# Patient Record
Sex: Female | Born: 2000 | Race: Black or African American | Hispanic: No | Marital: Single | Smoking: Never smoker
Health system: Southern US, Community
[De-identification: ages and names within clinical notes are randomized; demographics above are authoritative.]

## PROBLEM LIST (undated history)

## (undated) DIAGNOSIS — D509 Iron deficiency anemia, unspecified: Secondary | ICD-10-CM

## (undated) DIAGNOSIS — F419 Anxiety disorder, unspecified: Secondary | ICD-10-CM

## (undated) DIAGNOSIS — R718 Other abnormality of red blood cells: Secondary | ICD-10-CM

## (undated) DIAGNOSIS — D709 Neutropenia, unspecified: Secondary | ICD-10-CM

## (undated) HISTORY — DX: Neutropenia, unspecified: D70.9

## (undated) HISTORY — DX: Anxiety disorder, unspecified: F41.9

## (undated) HISTORY — PX: WISDOM TOOTH EXTRACTION: SHX21

## (undated) HISTORY — DX: Iron deficiency anemia, unspecified: D50.9

## (undated) HISTORY — DX: Other abnormality of red blood cells: R71.8

---
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
## 2018-03-31 ENCOUNTER — Ambulatory Visit (INDEPENDENT_AMBULATORY_CARE_PROVIDER_SITE_OTHER): Payer: BC Managed Care – PPO

## 2018-03-31 DIAGNOSIS — Z3042 Encounter for surveillance of injectable contraceptive: Secondary | ICD-10-CM

## 2018-03-31 DIAGNOSIS — Z3009 Encounter for other general counseling and advice on contraception: Secondary | ICD-10-CM | POA: Diagnosis not present

## 2018-03-31 MED ORDER — MEDROXYPROGESTERONE ACETATE 150 MG/ML IM SUSP
150.0000 mg | INTRAMUSCULAR | Status: DC
  Administered 2018-03-31: 150 mg via INTRAMUSCULAR

## 2018-05-25 ENCOUNTER — Telehealth: Payer: Self-pay | Admitting: Family Medicine

## 2018-05-25 NOTE — Telephone Encounter (Signed)
Copied from [place] [phone]. Topic: Quick Communication - See Telephone Encounter >> May 25, 2018  1:37 PM Mylinda Latina, NT wrote: CRM for notification. See Telephone encounter for: 05/25/18. Patient called and states she went on [facility] to odor a medication for acne and it is Doxcycline- Hyclate. She states that she needs a dr odor before she can get this medicine online. Please advise.Thank you  CB# [phone]

## 2018-05-26 NOTE — Telephone Encounter (Signed)
She will need to see a provider for a prescription I don't know what [facility] is

## 2018-05-29 NOTE — Telephone Encounter (Signed)
Spoke with Mendel Ryder (mom). Asked her to have pt to give the office a call to schedule an appt.

## 2018-06-30 ENCOUNTER — Ambulatory Visit (INDEPENDENT_AMBULATORY_CARE_PROVIDER_SITE_OTHER): Payer: BC Managed Care – PPO

## 2018-06-30 DIAGNOSIS — Z3042 Encounter for surveillance of injectable contraceptive: Secondary | ICD-10-CM | POA: Diagnosis not present

## 2018-06-30 MED ORDER — MEDROXYPROGESTERONE ACETATE 150 MG/ML IM SUSP
150.0000 mg | Freq: Once | INTRAMUSCULAR | Status: AC
  Administered 2018-06-30: 150 mg via INTRAMUSCULAR

## 2018-07-27 ENCOUNTER — Encounter: Payer: BC Managed Care – PPO | Admitting: Family Medicine

## 2018-08-24 ENCOUNTER — Ambulatory Visit: Payer: BC Managed Care – PPO

## 2018-09-19 ENCOUNTER — Other Ambulatory Visit: Payer: Self-pay | Admitting: Family Medicine

## 2018-09-20 NOTE — Telephone Encounter (Signed)
Patient has not been seen in over a year She will need a well woman exam with me or Benjamine Mola for this Depo Rx, or we'll ask her to go to student health or the health department Thank you

## 2018-09-20 NOTE — Telephone Encounter (Signed)
Copied from [place] [phone]. Topic: Quick Communication - Rx Refill/Question >> Sep 20, 2018  3:24 PM Bea Graff, NT wrote: Medication: medroxyPROGESTERone (DEPO-PROVERA) 150 MG/ML injection   Has the patient contacted their pharmacy? Yes.   (Agent: If no, request that the patient contact the pharmacy for the refill.) (Agent: If yes, when and what did the pharmacy advise?) No refills  Preferred Pharmacy (with phone number or street name): [place], [place] - [facility] [phone] (Phone) [phone] (Fax)    Agent: Please be advised that RX refills may take up to 3 business days. We ask that you follow-up with your pharmacy.

## 2018-09-21 NOTE — Telephone Encounter (Signed)
Tried to leave voicemail, but mailbox full. Will create CRM

## 2018-09-22 ENCOUNTER — Ambulatory Visit: Payer: BC Managed Care – PPO

## 2018-09-22 ENCOUNTER — Encounter: Payer: Self-pay | Admitting: Family Medicine

## 2018-09-27 ENCOUNTER — Encounter: Payer: Self-pay | Admitting: Family Medicine

## 2018-09-28 ENCOUNTER — Encounter: Payer: Self-pay | Admitting: Family Medicine

## 2018-10-09 ENCOUNTER — Encounter: Payer: Self-pay | Admitting: Family Medicine

## 2018-10-09 NOTE — Telephone Encounter (Signed)
Patient coming in for visit tomorrow; link was already sent to her

## 2018-10-09 NOTE — Telephone Encounter (Signed)
Closing old chart note

## 2018-10-10 ENCOUNTER — Ambulatory Visit (INDEPENDENT_AMBULATORY_CARE_PROVIDER_SITE_OTHER): Payer: BC Managed Care – PPO | Admitting: Family Medicine

## 2018-10-10 ENCOUNTER — Encounter: Payer: Self-pay | Admitting: Family Medicine

## 2018-10-10 VITALS — BP 118/68 | HR 95 | Temp 97.6°F | Ht 64.5 in | Wt 118.4 lb

## 2018-10-10 DIAGNOSIS — Z309 Encounter for contraceptive management, unspecified: Secondary | ICD-10-CM

## 2018-10-10 DIAGNOSIS — Z3042 Encounter for surveillance of injectable contraceptive: Secondary | ICD-10-CM | POA: Diagnosis not present

## 2018-10-10 DIAGNOSIS — Z118 Encounter for screening for other infectious and parasitic diseases: Secondary | ICD-10-CM

## 2018-10-10 DIAGNOSIS — F410 Panic disorder [episodic paroxysmal anxiety] without agoraphobia: Secondary | ICD-10-CM | POA: Diagnosis not present

## 2018-10-10 DIAGNOSIS — Z23 Encounter for immunization: Secondary | ICD-10-CM

## 2018-10-10 DIAGNOSIS — D485 Neoplasm of uncertain behavior of skin: Secondary | ICD-10-CM

## 2018-10-10 DIAGNOSIS — Z01419 Encounter for gynecological examination (general) (routine) without abnormal findings: Secondary | ICD-10-CM | POA: Diagnosis not present

## 2018-10-10 DIAGNOSIS — F332 Major depressive disorder, recurrent severe without psychotic features: Secondary | ICD-10-CM

## 2018-10-10 DIAGNOSIS — F5105 Insomnia due to other mental disorder: Secondary | ICD-10-CM

## 2018-10-10 LAB — POCT URINE PREGNANCY: Preg Test, Ur: NEGATIVE

## 2018-10-10 NOTE — Assessment & Plan Note (Addendum)
USPSTF grade A and B recommendations reviewed with patient; age-appropriate recommendations, preventive care, screening tests, etc discussed and encouraged; healthy living encouraged; see AVS for patient education given to patient Pap smear at age 17; will order chlamydia testing (no record of it on chart, though patient reports it was done earlier this year elsewhere)

## 2018-10-10 NOTE — Assessment & Plan Note (Signed)
I explained to the patient that I do NOT want to give her Depo today, as progesterone only birth control has been reported to worsen depression; I spoke with gynecology provider today while patient was here and they can provide a copper IUD without hormones; she agreed that hormonal contraception can worsen mood; stressed to patient that she can use abstinence or female or female condoms in the meantime and we'll have her get in to see the GYN provider ASAP for contraceptive management

## 2018-10-10 NOTE — Patient Instructions (Addendum)
Please go now to [facility] If they are not open or not able to help you, then go right to the emergency department  Use condoms for contraceptive  Take 500 mg of calcium daily (supplement)  Check your immunization records for the HPV and the meningitis vaccines Let us know OR go to student health or the health department to get those if needed  Try to avoid empty calories and get regular meals and snacks through the day  Here are some resources to help you if you feel you are in a mental health crisis:  [employer] - Call [phone]  for help - Website with more resources: GripTrip.com.pt  [place] - Call [phone] for help. - Mobile Crisis Program available 24 hours a day, 365 days a year. - Available for anyone of any age in [place] counties.  [facility] - Address: [address], [place] - Telephone: [phone]  - Hours of Operation: Sunday - Saturday - 8:00 a.m. - 8:00 p.m. - Medicaid, Medicare (Government Issued Only), BCBS, and [place] [facility], [place], Psychiatrists on-site to provide medication management, [place], and Peer Support Care.  [facility] - Call [phone] for help. - Mobile Crisis Program available 24 hours a day, 365 days a year. - Available for anyone of any age in [place]   Please put the phone down at 11 pm and don't pick it back up until morning Try to use the phone less than 2 hours per day total Put the phone down to avoid constant influx of informationCoping With Depression, Teen Depression is an experience of feeling down, blue, or sad. Depression can affect your thoughts and feelings, relationships, daily activities, and physical health. It is caused by changes in your brain that can be triggered by stress in your  life or a serious loss. Everyone experiences occasional disappointment, sadness, and loss in their lives. When you are feeling down, blue, or sad for at least 2 weeks in a row, it may mean that you have depression. If you receive a diagnosis of depression, your health care provider will tell you which type of depression you have and the possible treatments to help. How can depression affect me? Being depressed can make daily activities more difficult. It can negatively affect your daily life, from school and sports performance to work and relationships. When you are depressed, you may:  Want to be alone.  Avoid interacting with others.  Avoid doing the things you usually like to do.  Notice changes in your sleep habits.  Find it harder than usual to wake up and go to school or work.  Feel angry at everyone.  Feel like you do not have any patience.  Have trouble concentrating.  Feel tired all the time.  Notice changes in your appetite.  Lose or gain weight without trying.  Have constant headaches or stomachaches.  Think about death or attempting suicide often. What are things I can do to deal with depression? If you have had symptoms of depression for more than 2 weeks, talk with your parents or an adult you trust, such as a Social worker at school or church or a Leisure centre manager. You might be tempted to only tell friends, but you should tell an adult too. The hardest step in dealing with depression is admitting that you are feeling it to someone. The more people who know, the  more likely you will be to get some help. Certain types of counseling can be very helpful in treating depression. A counseling professional can assess what treatments are going to be most helpful for you. These may include:  Talk therapy.  Medicines.  Brain stimulation therapy. There are a number of other things you can do that can help you cope with depression on a daily basis, including:  Spending time in  nature.  Spending time with trusted friends who help you feel better.  Taking time to think about the positive things in your life and to feel grateful for them.  Exercising, such as playing an active game with some friends or going for a run.  Spending less time using electronics, especially at night before bed. The screens of TVs, computers, tablets, and phones make your brain think it is time to get up rather than go to bed.  Avoiding spending too much time spacing out on TV or video games. This might feel good for a while, but it ends up just being a way to avoid the feelings of depression. What should I do if my depression gets worse? If you are having trouble managing your depression or if your depression gets worse, talk to your health care provider about making adjustments to your treatment plan. You should get help immediately if:  You feel suicidal and are making a plan to commit suicide.  You are drinking or using drugs to stop the pain from your depression.  You are cutting yourself or thinking about cutting yourself.  You are thinking about hurting others and are making a plan to do so.  You believe the world would be better off without you in it.  You are isolating yourself completely and not talking with anyone. If you find yourself in any of these situations, you should do one of the following:  Immediately tell your parents or best friend.  Call and go see your health care provider or health professional.  Call the suicide prevention hotline 9714820171 in the [place]  Text the crisis line 810-447-5615 in the [place] Where can I get support? It is important to know that although depression is serious, you can find support from a variety of sources. Sources of help may include:  Suicide prevention, crisis prevention, and depression hotlines.  School teachers, counselors, Sports administrator, or clergy.  Parents or other family members.  Support groups. You can locate a  counselor or support group in your area from one of the following sources:  [place]: www.mentalhealthamerica.net  Anxiety and Depression Association of [place] (ADAA): https://www.clark.net/  National Alliance on Mental Illness (NAMI): www.nami.org This information is not intended to replace advice given to you by your health care provider. Make sure you discuss any questions you have with your health care provider. Document Released: 10/24/2015 Document Revised: 03/11/2016 Document Reviewed: 10/24/2015 Elsevier Interactive Patient Education  2019 [place]  After being diagnosed with an anxiety disorder, you may be relieved to know why you have felt or behaved a certain way. It is natural to also feel overwhelmed about the treatment ahead and what it will mean for your life. With care and support, you can manage this condition and recover from it. How to cope with anxiety Dealing with stress Stress is your body's reaction to life changes and events, both good and bad. Stress can last just a few hours or it can be ongoing. Stress can play a major role in anxiety,  so it is important to learn both how to cope with stress and how to think about it differently. Talk with your health care provider or a counselor to learn more about stress reduction. He or she may suggest some stress reduction techniques, such as:  Music therapy. This can include creating or listening to music that you enjoy and that inspires you.  Mindfulness-based meditation. This involves being aware of your normal breaths, rather than trying to control your breathing. It can be done while sitting or walking.  Centering prayer. This is a kind of meditation that involves focusing on a word, phrase, or sacred image that is meaningful to you and that brings you peace.  Deep breathing. To do this, expand your stomach and inhale slowly through your nose. Hold your breath for 3-5 seconds. Then exhale  slowly, allowing your stomach muscles to relax.  Self-talk. This is a skill where you identify thought patterns that lead to anxiety reactions and correct those thoughts.  Muscle relaxation. This involves tensing muscles then relaxing them. Choose a stress reduction technique that fits your lifestyle and personality. Stress reduction techniques take time and practice. Set aside 5-15 minutes a day to do them. Therapists can offer training in these techniques. The training may be covered by some insurance plans. Other things you can do to manage stress include:  Keeping a stress diary. This can help you learn what triggers your stress and ways to control your response.  Thinking about how you respond to certain situations. You may not be able to control everything, but you can control your reaction.  Making time for activities that help you relax, and not feeling guilty about spending your time in this way. Therapy combined with coping and stress-reduction skills provides the best chance for successful treatment. Medicines Medicines can help ease symptoms. Medicines for anxiety include:  Anti-anxiety drugs.  Antidepressants.  Beta-blockers. Medicines may be used as the main treatment for anxiety disorder, along with therapy, or if other treatments are not working. Medicines should be prescribed by a health care provider. Relationships Relationships can play a big part in helping you recover. Try to spend more time connecting with trusted friends and family members. Consider going to couples counseling, taking family education classes, or going to family therapy. Therapy can help you and others better understand the condition. How to recognize changes in your condition Everyone has a different response to treatment for anxiety. Recovery from anxiety happens when symptoms decrease and stop interfering with your daily activities at home or work. This may mean that you will start to:  Have  better concentration and focus.  Sleep better.  Be less irritable.  Have more energy.  Have improved memory. It is important to recognize when your condition is getting worse. Contact your health care provider if your symptoms interfere with home or work and you do not feel like your condition is improving. Where to find help and support: You can get help and support from these sources:  Self-help groups.  Online and [employer].  A trusted spiritual leader.  Couples counseling.  Family education classes.  Family therapy. Follow these instructions at home:  Eat a healthy diet that includes plenty of vegetables, fruits, whole grains, low-fat dairy products, and lean protein. Do not eat a lot of foods that are high in solid fats, added sugars, or salt.  Exercise. Most adults should do the following: ? Exercise for at least 150 minutes each week. The exercise should increase  your heart rate and make you sweat (moderate-intensity exercise). ? Strengthening exercises at least twice a week.  Cut down on caffeine, tobacco, alcohol, and other potentially harmful substances.  Get the right amount and quality of sleep. Most adults need 7-9 hours of sleep each night.  Make choices that simplify your life.  Take over-the-counter and prescription medicines only as told by your health care provider.  Avoid caffeine, alcohol, and certain over-the-counter cold medicines. These may make you feel worse. Ask your pharmacist which medicines to avoid.  Keep all follow-up visits as told by your health care provider. This is important. Questions to ask your health care provider  Would I benefit from therapy?  How often should I follow up with a health care provider?  How long do I need to take medicine?  Are there any long-term side effects of my medicine?  Are there any alternatives to taking medicine? Contact a health care provider if:  You have a hard time staying  focused or finishing daily tasks.  You spend many hours a day feeling worried about everyday life.  You become exhausted by worry.  You start to have headaches, feel tense, or have nausea.  You urinate more than normal.  You have diarrhea. Get help right away if:  You have a racing heart and shortness of breath.  You have thoughts of hurting yourself or others. If you ever feel like you may hurt yourself or others, or have thoughts about taking your own life, get help right away. You can go to your nearest emergency department or call:  Your local emergency services (911 in the [place]  A suicide crisis helpline, such as the [place] at 7696077209. This is open 24-hours a day. Summary  Taking steps to deal with stress can help calm you.  Medicines cannot cure anxiety disorders, but they can help ease symptoms.  Family, friends, and partners can play a big part in helping you recover from an anxiety disorder. This information is not intended to replace advice given to you by your health care provider. Make sure you discuss any questions you have with your health care provider. Document Released: 09/28/2016 Document Revised: 09/28/2016 Document Reviewed: 09/28/2016 Elsevier Interactive Patient Education  2019 [employer].

## 2018-10-10 NOTE — Progress Notes (Signed)
BP 118/68   Pulse 95   Temp 97.6 F (36.4 C)   Ht 5' 4.5" (1.638 m)   Wt 118 lb 6.4 oz (53.7 kg)   LMP  (LMP Unknown)   SpO2 94%   BMI 20.01 kg/m    Subjective:    Patient ID: Margaret Dunn, female    DOB: [birth year], 17 y.o.   MRN: [id number]  HPI: Margaret Dunn is a 17 y.o. female  Chief Complaint  Patient presents with  . Annual Exam    HPI Here for physical Pleased with Depo; aware of risks of osteoporosis and failure to protect against STDs Wishes to continue  USPSTF grade A and B recommendations Depression:  Depression screen [facility] 2/9 10/10/2018 07/22/2017  Decreased Interest 2 0  Down, Depressed, Hopeless 1 0  PHQ - 2 Score 3 0  Altered sleeping 3 -  Tired, decreased energy 3 -  Change in appetite 3 -  Feeling bad or failure about yourself  2 -  Trouble concentrating 3 -  Moving slowly or fidgety/restless 0 -  Suicidal thoughts 1 -  PHQ-9 Score 18 -  Difficult doing work/chores Somewhat difficult -   GAD 7 : Generalized Anxiety Score 10/10/2018  Nervous, Anxious, on Edge 2  Control/stop worrying 3  Worry too much - different things 3  Trouble relaxing 2  Restless 0  Easily annoyed or irritable 3  Afraid - awful might happen 3  Total GAD 7 Score 16  Anxiety Difficulty Somewhat difficult   She has been having thoughts of self harm, as recently as December 13th; she would do an overdose of pills  Hypertension: BP Readings from Last 3 Encounters:  10/10/18 118/68  07/22/17 108/62  10/21/16 118/78   Obesity: Wt Readings from Last 3 Encounters:  10/10/18 118 lb 6.4 oz (53.7 kg) (31 %, Z= -0.50)*  07/22/17 129 lb 1.6 oz (58.6 kg) (58 %, Z= 0.19)*  10/21/16 119 lb 9 oz (54.2 kg) (42 %, Z= -0.20)*   * Growth percentiles are based on CDC (Girls, 2-20 Years) data.   BMI Readings from Last 3 Encounters:  10/10/18 20.01 kg/m (28 %, Z= -0.58)*  07/22/17 21.82 kg/m (55 %, Z= 0.12)*  07/15/16 18.48 kg/m (15 %, Z= -1.04)*   * Growth  percentiles are based on CDC (Girls, 2-20 Years) data.     Skin cancer: new very dark mole on the right side of the neck, just appeared Lung cancer:  nonsmoker Breast cancer: no lumps Colorectal cancer: no fam hx Cervical cancer screening: start at age 29 BRCA gene screening: family hx of breast and/or ovarian cancer and/or metastatic prostate cancer? no HIV, hep B, hep C: already tested STD testing and prevention (chl/gon/syphilis): already tested Intimate partner violence: no abuse Contraception: Depo Osteoporosis: start calcium today; cautioned about risk of bone loss with ongoing use of Depo Fall prevention/vitamin D: discussed Immunizations: tetanus and flu today; patient to check on meningitis vaccine and HPV vaccine Diet: not a good eater; not enough fruits and veggies; just doesn't go to the caf, not really hungry; drinking empty calories; declined nutritionist Exercise: used to exercise, but stopped; planning to start exercising again Alcohol:    Office Visit from 10/10/2018 in [facility]  AUDIT-C Score  0      Tobacco use: no AAA: n/a Aspirin: n/a Glucose: check today No results found for: GLUCOSE, GLUCAP Lipids:  No results found for: CHOL No results found for: HDL No results found for:  [place] No results found for: TRIG No results found for: CHOLHDL No results found for: LDLDIRECT   Depression screen [facility] 2/9 10/10/2018 07/22/2017  Decreased Interest 2 0  Down, Depressed, Hopeless 1 0  PHQ - 2 Score 3 0  Altered sleeping 3 -  Tired, decreased energy 3 -  Change in appetite 3 -  Feeling bad or failure about yourself  2 -  Trouble concentrating 3 -  Moving slowly or fidgety/restless 0 -  Suicidal thoughts 1 -  PHQ-9 Score 18 -  Difficult doing work/chores Somewhat difficult -   Fall Risk  10/10/2018 07/22/2017  Falls in the past year? 0 No    Relevant past medical, surgical, family and social history reviewed Past Medical History:    Diagnosis Date  . Anxiety   . Iron deficiency anemia 10/22/2016  . Neutropenia ([place]) 10/22/2016  . 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 WISDOM TOOTH EXTRACTION     History reviewed. No pertinent family history. Social History   Tobacco Use  . Smoking status: Never Smoker  . Smokeless tobacco: Never Used  Substance Use Topics  . Alcohol use: No  . Drug use: No     Office Visit from 10/10/2018 in [facility]  AUDIT-C Score  0      Interim medical history since last visit reviewed. Allergies and medications reviewed  Review of Systems  Respiratory: Negative for shortness of breath.   Cardiovascular: Negative for chest pain.   Per HPI unless specifically indicated above     Objective:    BP 118/68   Pulse 95   Temp 97.6 F (36.4 C)   Ht 5' 4.5" (1.638 m)   Wt 118 lb 6.4 oz (53.7 kg)   LMP  (LMP Unknown)   SpO2 94%   BMI 20.01 kg/m   Wt Readings from Last 3 Encounters:  10/10/18 118 lb 6.4 oz (53.7 kg) (31 %, Z= -0.50)*  07/22/17 129 lb 1.6 oz (58.6 kg) (58 %, Z= 0.19)*  10/21/16 119 lb 9 oz (54.2 kg) (42 %, Z= -0.20)*   * Growth percentiles are based on CDC (Girls, 2-20 Years) data.    Physical Exam Constitutional:      Appearance: Normal appearance. She is well-developed.  HENT:     Right Ear: Hearing, tympanic membrane, ear canal and external ear normal.     Left Ear: Hearing, tympanic membrane, ear canal and external ear normal.     Mouth/Throat:     Pharynx: No posterior oropharyngeal erythema.  Eyes:     General: No scleral icterus.       Right eye: No hordeolum.        Left eye: No hordeolum.     Conjunctiva/sclera: Conjunctivae normal.  Neck:     Thyroid: No thyromegaly.     Vascular: No carotid bruit.  Cardiovascular:     Rate and Rhythm: Normal rate and regular rhythm.  No extrasystoles are present.    Heart sounds: Normal heart sounds, S1 normal and  S2 normal.  Pulmonary:     Effort: Pulmonary effort is normal. No respiratory distress.     Breath sounds: Normal breath sounds.  Abdominal:     General: Bowel sounds are normal. There is no distension or abdominal bruit.     Palpations: Abdomen is soft. There is no mass or pulsatile mass.     Tenderness: There is no abdominal  tenderness.     Hernia: No hernia is present.  Musculoskeletal: Normal range of motion.  Lymphadenopathy:     Head:     Right side of head: No submandibular adenopathy.     Left side of head: No submandibular adenopathy.     Cervical: No cervical adenopathy.  Skin:    General: Skin is warm and dry.     Coloration: Skin is not pale.     Findings: Lesion (macular very dark brown to black nevus on the RIGHT side neck) present. No bruising or ecchymosis.       Neurological:     Mental Status: She is alert.     Motor: No tremor or abnormal muscle tone.     Gait: Gait normal.     Deep Tendon Reflexes:     Reflex Scores:      Patellar reflexes are 2+ on the right side and 2+ on the left side. Psychiatric:        Mood and Affect: Mood is anxious and depressed. Affect is tearful.        Speech: Speech normal. Speech is not rapid and pressured.        Behavior: Behavior normal. Behavior is not slowed or aggressive.        Thought Content: Thought content includes suicidal ideation. Thought content includes suicidal plan.     Comments: Fair eye contact with examiner     Results for orders placed or performed in visit on 10/10/18  POCT urine pregnancy  Result Value Ref Range   Preg Test, Ur Negative Negative      Assessment & Plan:   Problem List Items Addressed This Visit      Other   Well woman exam - Primary    USPSTF grade A and B recommendations reviewed with patient; age-appropriate recommendations, preventive care, screening tests, etc discussed and encouraged; healthy living encouraged; see AVS for patient education given to patient Pap smear at  age 1; will order chlamydia testing (no record of it on chart, though patient reports it was done earlier this year elsewhere)      Relevant Orders   CBC with Differential/Platelet   COMPLETE METABOLIC PANEL WITH GFR   Lipid panel   TSH   Depo-Provera contraceptive status    I explained to the patient that I do NOT want to give her Depo today, as progesterone only birth control has been reported to worsen depression; I spoke with gynecology provider today while patient was here and they can provide a copper IUD without hormones; she agreed that hormonal contraception can worsen mood; stressed to patient that she can use abstinence or female or female condoms in the meantime and we'll have her get in to see the GYN provider ASAP for contraceptive management      Relevant Orders   POCT urine pregnancy (Completed)    Other Visit Diagnoses    Need for influenza vaccination       offered, given   Relevant Orders   Flu Vaccine QUAD 6+ mos PF IM (Fluarix Quad PF) (Completed)   Need for tetanus booster       offered, given   Relevant Orders   Tdap vaccine greater than or equal to 7yo IM (Completed)   Neoplasm of uncertain behavior of skin       refer to derm for evaluation   Relevant Orders   Ambulatory referral to Dermatology   Encounter for contraceptive management, unspecified type  discussed risk of worsening depression with hormonal contraceptives; other options such as female/female condoms, copper IUD; refer to GYN   Relevant Orders   Ambulatory referral to Obstetrics / Gynecology   Panic attack       refer to psychiatrist   Relevant Orders   Ambulatory referral to Psychiatry   Severe episode of recurrent major depressive disorder, without psychotic features ([place])       see AVS; she will go with her mother right from here to [place]; if [facility] not open, go to ER; need eval by psychiatrist; numbers provided; she agrees volultarily   Relevant Orders   Ambulatory referral to Psychiatry     Vitamin B12   VITAMIN D 25 Hydroxy (Vit-D Deficiency, Fractures)   Insomnia due to mental condition       refer to psychiatrist; stressed tot he patient the iportance of turning off her cell phone after 11 pm (she is on it until 5 am apparently)   Relevant Orders   Ambulatory referral to Psychiatry   Screening for chlamydial disease       patient reports done elsewhere, but no record of this; will test (vag swab, self-collection) upon her return   Relevant Orders   Cervicovaginal ancillary only      Follow up plan: Return in about 2 weeks (around 10/24/2018) for follow-up visit with Dr. Sanda Klein.  An after-visit summary was printed and given to the patient at [place].  Please see the patient instructions which may contain other information and recommendations beyond what is mentioned above in the assessment and plan.  No orders of the defined types were placed in this encounter.   Orders Placed This Encounter  Procedures  . Tdap vaccine greater than or equal to 7yo IM  . Flu Vaccine QUAD 6+ mos PF IM (Fluarix Quad PF)  . CBC with Differential/Platelet  . COMPLETE METABOLIC PANEL WITH GFR  . Lipid panel  . TSH  . Vitamin B12  . VITAMIN D 25 Hydroxy (Vit-D Deficiency, Fractures)  . Ambulatory referral to Dermatology  . Ambulatory referral to Obstetrics / Gynecology  . Ambulatory referral to Psychiatry  . POCT urine pregnancy

## 2018-10-11 ENCOUNTER — Other Ambulatory Visit: Payer: Self-pay | Admitting: Family Medicine

## 2018-10-11 DIAGNOSIS — E559 Vitamin D deficiency, unspecified: Secondary | ICD-10-CM | POA: Insufficient documentation

## 2018-10-11 LAB — LIPID PANEL
CHOLESTEROL: 165 mg/dL (ref <170)
HDL: 48 mg/dL (ref >45)
LDL Cholesterol (Calc): 103 mg/dL (calc) (ref <110)
Non-HDL Cholesterol (Calc): 117 mg/dL (calc) (ref <120)
Total CHOL/HDL Ratio: 3.4 (calc) (ref <5.0)
Triglycerides: 47 mg/dL (ref <90)

## 2018-10-11 LAB — COMPLETE METABOLIC PANEL WITH GFR
AG Ratio: 1.7 (calc) (ref 1.0–2.5)
ALT: 10 U/L (ref 5–32)
AST: 17 U/L (ref 12–32)
Albumin: 4.3 g/dL (ref 3.6–5.1)
Alkaline phosphatase (APISO): 76 U/L (ref 47–176)
BUN: 10 mg/dL (ref 7–20)
CO2: 23 mmol/L (ref 20–32)
Calcium: 9.8 mg/dL (ref 8.9–10.4)
Chloride: 105 mmol/L (ref 98–110)
Creat: 0.91 mg/dL (ref 0.50–1.00)
GFR, Est African American: 106 mL/min/{1.73_m2} (ref >=60)
GFR, Est Non African American: 91 mL/min/{1.73_m2} (ref >=60)
Globulin: 2.6 g/dL (calc) (ref 2.0–3.8)
Glucose, Bld: 83 mg/dL (ref 65–99)
Potassium: 4 mmol/L (ref 3.8–5.1)
SODIUM: 137 mmol/L (ref 135–146)
Total Bilirubin: 0.5 mg/dL (ref 0.2–1.1)
Total Protein: 6.9 g/dL (ref 6.3–8.2)

## 2018-10-11 LAB — CBC WITH DIFFERENTIAL/PLATELET
Absolute Monocytes: 431 cells/uL (ref 200–950)
Basophils Absolute: 40 cells/uL (ref 0–200)
Basophils Relative: 0.9 %
EOS PCT: 1.4 %
Eosinophils Absolute: 62 cells/uL (ref 15–500)
HEMATOCRIT: 39 % (ref 35.0–45.0)
Hemoglobin: 13.4 g/dL (ref 11.7–15.5)
Lymphs Abs: 2354 cells/uL (ref 850–3900)
MCH: 30 pg (ref 27.0–33.0)
MCHC: 34.4 g/dL (ref 32.0–36.0)
MCV: 87.2 fL (ref 80.0–100.0)
MPV: 9.9 fL (ref 7.5–12.5)
Monocytes Relative: 9.8 %
NEUTROS PCT: 34.4 %
Neutro Abs: 1514 cells/uL (ref 1500–7800)
Platelets: 302 10*3/uL (ref 140–400)
RBC: 4.47 10*6/uL (ref 3.80–5.10)
RDW: 12.2 % (ref 11.0–15.0)
Total Lymphocyte: 53.5 %
WBC: 4.4 10*3/uL (ref 3.8–10.8)

## 2018-10-11 LAB — VITAMIN D 25 HYDROXY (VIT D DEFICIENCY, FRACTURES): VIT D 25 HYDROXY: 14 ng/mL — AB (ref 30–100)

## 2018-10-11 LAB — VITAMIN B12: Vitamin B-12: 1031 pg/mL (ref 200–1100)

## 2018-10-11 LAB — TSH: TSH: 2.06 mIU/L

## 2018-10-11 MED ORDER — VITAMIN D (ERGOCALCIFEROL) 1.25 MG (50000 UNIT) PO CAPS: 50000.0000 [IU] | ORAL_CAPSULE | ORAL | 1 refills | Status: DC

## 2018-10-13 ENCOUNTER — Telehealth: Payer: Self-pay | Admitting: Obstetrics and Gynecology

## 2018-10-13 ENCOUNTER — Telehealth: Payer: Self-pay | Admitting: Obstetrics & Gynecology

## 2018-10-13 NOTE — Telephone Encounter (Signed)
paragard reserved for this patient.

## 2018-10-13 NOTE — Telephone Encounter (Signed)
Patient is schedule 10/20/17 with [facility] for paraguard

## 2018-10-13 NOTE — Telephone Encounter (Signed)
[facility] referring for Requesting copper IUD or other non-hormonal contraception for patient. Called and left voicemail for patient to call back to be schedule

## 2018-10-20 ENCOUNTER — Encounter: Payer: Self-pay | Admitting: Obstetrics and Gynecology

## 2018-10-20 ENCOUNTER — Ambulatory Visit (INDEPENDENT_AMBULATORY_CARE_PROVIDER_SITE_OTHER): Payer: BC Managed Care – PPO | Admitting: Obstetrics and Gynecology

## 2018-10-20 ENCOUNTER — Other Ambulatory Visit (HOSPITAL_COMMUNITY)
Admission: RE | Admit: 2018-10-20 | Discharge: 2018-10-20 | Disposition: A | Discharge status: Home / Self Care | Payer: BC Managed Care – PPO | Source: Ambulatory Visit | Attending: Obstetrics and Gynecology | Admitting: Obstetrics and Gynecology

## 2018-10-20 VITALS — BP 108/60 | Ht 64.0 in | Wt 121.0 lb

## 2018-10-20 DIAGNOSIS — Z113 Encounter for screening for infections with a predominantly sexual mode of transmission: Secondary | ICD-10-CM | POA: Insufficient documentation

## 2018-10-20 DIAGNOSIS — Z30014 Encounter for initial prescription of intrauterine contraceptive device: Secondary | ICD-10-CM

## 2018-10-20 MED ORDER — MISOPROSTOL 100 MCG PO TABS: 100.0000 ug | ORAL_TABLET | Freq: Once | ORAL | 0 refills | Status: DC

## 2018-10-20 NOTE — Progress Notes (Signed)
Arnetha Courser, MD   Chief Complaint  Patient presents with  . Contraception    IUD consultation    HPI:      [name] is a 18 y.o. G0P0000 who LMP was No LMP recorded (lmp unknown)., presents today for NP IUD consultation, referred by PCP. Pt was on depo for about 2 yrs. Wants to try non-hormonal method due to depression sx. Pt is not having any vaginal bleeding/dysmen with depo. Pt states menses prior to depo were monthly and "long". Last depo given about 10/19, hasn't had period yet.  Pt is sex active, using condoms. Had neg STD testing fall 2019 at school with current partner. Hx of chlamydia in the distant past, treated and had neg TOC.    Past Medical History:  Diagnosis Date  . Anxiety   . Iron deficiency anemia 10/22/2016  . Neutropenia ([place]) 10/22/2016  . 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 WISDOM TOOTH EXTRACTION      History reviewed. No pertinent family history.  Social History   Socioeconomic History  . Marital status: Significant Other    Spouse name: [name]  . Number of children: Not on file  . Years of education: 12  . Highest education level: Some college, no degree  Occupational History  . Not on file  Social Needs  . Financial resource strain: Not hard at all  . Food insecurity:    Worry: Never true    Inability: Never true  . Transportation needs:    Medical: No    Non-medical: No  Tobacco Use  . Smoking status: Never Smoker  . Smokeless tobacco: Never Used  Substance and Sexual Activity  . Alcohol use: No  . Drug use: No  . Sexual activity: Yes    Birth control/protection: None  Lifestyle  . Physical activity:    Days per week: 0 days    Minutes per session: 0 min  . Stress: Rather much  Relationships  . Social connections:    Talks on phone: More than three times a week    Gets together: More than three times a week    Attends religious  service: More than 4 times per year    Active member of club or organization: Yes    Attends meetings of clubs or organizations: Never    Relationship status: Never married  . Intimate partner violence:    Fear of current or ex partner: No    Emotionally abused: No    Physically abused: No    Forced sexual activity: No  Other Topics Concern  . Not on file  Social History Narrative  . Not on file    Outpatient Medications Prior to Visit  Medication Sig Dispense Refill  . Vitamin D, Ergocalciferol, (DRISDOL) 1.25 MG (50000 UT) CAPS capsule Take 1 capsule (50,000 Units total) by mouth every 7 (seven) days. 4 capsule 1  . PARAGARD INTRAUTERINE COPPER IU by Intrauterine route.     No facility-administered medications prior to visit.       ROS:  Review of Systems  Constitutional: Negative for fatigue, fever and unexpected weight change.  Respiratory: Negative for cough, shortness of breath and wheezing.   Cardiovascular: Negative for chest pain, palpitations and leg swelling.  Gastrointestinal: Negative for blood in stool, constipation, diarrhea, nausea and vomiting.  Endocrine: Negative for cold intolerance, heat intolerance and polyuria.  Genitourinary: Negative for  dyspareunia, dysuria, flank pain, frequency, genital sores, hematuria, menstrual problem, pelvic pain, urgency, vaginal bleeding, vaginal discharge and vaginal pain.  Musculoskeletal: Negative for back pain, joint swelling and myalgias.  Skin: Negative for rash.  Neurological: Negative for dizziness, syncope, light-headedness, numbness and headaches.  Hematological: Negative for adenopathy.  Psychiatric/Behavioral: Negative for agitation, confusion, sleep disturbance and suicidal ideas. The patient is not nervous/anxious.    BREAST: No symptoms   OBJECTIVE:   Vitals:  BP 108/60   Ht 5\' 4"  (1.626 m)   Wt 121 lb (54.9 kg)   LMP  (LMP Unknown)   BMI 20.77 kg/m   Physical Exam Vitals signs reviewed.    Constitutional:      Appearance: She is well-developed.  Pulmonary:     Effort: Pulmonary effort is normal.  Genitourinary:    General: Normal vulva.     Pubic Area: No rash.      Labia:        Right: No rash, tenderness or lesion.        Left: No rash, tenderness or lesion.      Vagina: Normal. No vaginal discharge, erythema or tenderness.     Cervix: Normal.     Uterus: Normal. Not enlarged and not tender.      Adnexa: Right adnexa normal and left adnexa normal.       Right: No mass or tenderness.         Left: No mass or tenderness.    Musculoskeletal: Normal range of motion.  Neurological:     Mental Status: She is alert and oriented to person, place, and time.  Psychiatric:        Behavior: Behavior normal.        Thought Content: Thought content normal.     Assessment/Plan: Encounter for initial prescription of intrauterine contraceptive device (IUD) - Paraguard discussed including pros/cons/risks/benefits. Given nullip, Rx cytotec and NSAIDs 1 hr before appt. RTO in 2 days (before goes back to college) - Plan: misoprostol (CYTOTEC) 100 MCG tablet  Screening for STD (sexually transmitted disease) - Plan: Cervicovaginal ancillary only    Meds ordered this encounter  Medications  . misoprostol (CYTOTEC) 100 MCG tablet    Sig: Take 1 tablet (100 mcg total) by mouth once for 1 dose. 1 hour before appt    Dispense:  1 tablet    Refill:  0    Order Specific Question:   Supervising Provider    Answer:   Gae Dry [[id number]]      Return in 3 days (on 10/23/2018) for Paragard insertion with ABC 10/23/17.  Hollynn Garno B. Alyiah Ulloa, PA-C 10/20/2018 2:44 PM

## 2018-10-20 NOTE — Patient Instructions (Signed)
I value your feedback and entrusting us with your care. If you get a  patient survey, I would appreciate you taking the time to let us know about your experience today. Thank you! 

## 2018-10-23 ENCOUNTER — Ambulatory Visit (INDEPENDENT_AMBULATORY_CARE_PROVIDER_SITE_OTHER): Payer: BC Managed Care – PPO | Admitting: Obstetrics and Gynecology

## 2018-10-23 ENCOUNTER — Encounter: Payer: Self-pay | Admitting: Obstetrics and Gynecology

## 2018-10-23 VITALS — BP 110/80 | Ht 64.0 in | Wt 121.0 lb

## 2018-10-23 DIAGNOSIS — Z30430 Encounter for insertion of intrauterine contraceptive device: Secondary | ICD-10-CM

## 2018-10-23 LAB — CERVICOVAGINAL ANCILLARY ONLY
Chlamydia: NEGATIVE
Neisseria Gonorrhea: NEGATIVE

## 2018-10-23 NOTE — Patient Instructions (Signed)
I value your feedback and entrusting us with your care. If you get a  patient survey, I would appreciate you taking the time to let us know about your experience today. Thank you!  [facility] [phone]  Instructions after IUD insertion  Most women experience no significant problems after insertion of an IUD, however minor cramping and spotting for a few days is common. Cramps may be treated with ibuprofen 800mg every 8 hours or Tylenol 650 mg every 4 hours. Contact [facility] immediately if you experience any of the following symptoms during the next week: temperature >99.6 degrees, worsening pelvic pain, abdominal pain, fainting, unusually heavy vaginal bleeding, foul vaginal discharge, or if you think you have expelled the IUD.  Nothing inserted in the vagina for 48 hours. You will be scheduled for a follow up visit in approximately four weeks.  You should check monthly to be sure you can feel the IUD strings in the upper vagina. If you are having a monthly period, try to check after each period. If you cannot feel the IUD strings,  contact [facility] immediately so we can do an exam to determine if the IUD has been expelled.   Please use backup protection until we can confirm the IUD is in place.  Call [facility] if you are exposed to or diagnosed with a sexually transmitted infection, as we will need to discuss whether it is safe for you to continue using an IUD.   

## 2018-10-23 NOTE — Progress Notes (Signed)
   Chief Complaint  Patient presents with  . Contraception    IUD insertion     IUD PROCEDURE NOTE:  Margaret Dunn is a 18 y.o. G0P0000 here for Paragard  IUD insertion for [facility]. Currently on depo, due for injection later this month. Wants non-hormonal method due to depression sx. STD testing done 10/20/18.  BP 110/80   Ht 5\' 4"  (1.626 m)   Wt 121 lb (54.9 kg)   LMP  (LMP Unknown)   BMI 20.77 kg/m   IUD Insertion Procedure Note Patient identified, informed consent performed, consent signed.   Discussed risks of irregular bleeding, cramping, infection, malpositioning or misplacement of the IUD outside the uterus which may require further procedure such as laparoscopy, risk of failure <1%. Time out was performed.    Speculum placed in the vagina.  Cervix visualized.  Cleaned with Betadine x 2.  Grasped anteriorly with a single tooth tenaculum.  Uterus sounded to 6.0 cm.   IUD placed per manufacturer's recommendations.  Strings trimmed to 3 cm. Tenaculum was removed, good hemostasis noted.  Patient tolerated procedure well.   ASSESSMENT:  Encounter for insertion of intrauterine contraceptive device (IUD)   Plan:  Patient was given post-procedure instructions.  She was advised to have backup contraception for one week.   Call if you are having increasing pain, cramps or bleeding or if you have a fever greater than 100.4 degrees F., shaking chills, nausea or vomiting. Patient was also asked to check IUD strings periodically and follow up in 4 weeks for IUD check.  Return in about 4 weeks (around 11/20/2018) for IUD f/u.  Taegan Haider B. Selyna Klahn, PA-C 10/23/2018 9:20 AM

## 2018-11-27 ENCOUNTER — Ambulatory Visit: Payer: BC Managed Care – PPO | Admitting: Obstetrics and Gynecology

## 2018-12-25 ENCOUNTER — Ambulatory Visit (INDEPENDENT_AMBULATORY_CARE_PROVIDER_SITE_OTHER): Payer: BC Managed Care – PPO | Admitting: Certified Nurse Midwife

## 2018-12-25 ENCOUNTER — Encounter: Payer: Self-pay | Admitting: Certified Nurse Midwife

## 2018-12-25 VITALS — BP 128/84 | HR 82 | Ht 64.0 in | Wt 125.9 lb

## 2018-12-25 DIAGNOSIS — Z862 Personal history of diseases of the blood and blood-forming organs and certain disorders involving the immune mechanism: Secondary | ICD-10-CM | POA: Diagnosis not present

## 2018-12-25 DIAGNOSIS — Z30431 Encounter for routine checking of intrauterine contraceptive device: Secondary | ICD-10-CM

## 2018-12-25 DIAGNOSIS — N921 Excessive and frequent menstruation with irregular cycle: Secondary | ICD-10-CM | POA: Diagnosis not present

## 2018-12-25 DIAGNOSIS — N644 Mastodynia: Secondary | ICD-10-CM

## 2018-12-25 DIAGNOSIS — Z975 Presence of (intrauterine) contraceptive device: Secondary | ICD-10-CM

## 2018-12-25 NOTE — Patient Instructions (Addendum)
WE WOULD LOVE TO HEAR FROM YOU!!!!   Thank you Margaret Dunn for visiting [facility].  Providing our patients with the best experience possible is really important to [place], and we hope that you felt that on your recent visit. The most valuable feedback we get comes from [place]!!    If you receive a survey please take a couple of minutes to let us know how we did.Thank you for continuing to trust [place] with your care.   [facility]   Ethinyl Estradiol; Norethindrone Acetate; Ferrous fumarate tablets or capsules What is this medicine? ETHINYL ESTRADIOL; NORETHINDRONE ACETATE; FERROUS FUMARATE (ETH in il es tra DYE ole; nor eth IN drone AS e tate; FER [place] FUE ma rate) is an oral contraceptive. The products combine two types of female hormones, an estrogen and a progestin. They are used to prevent ovulation and pregnancy. Some products are also used to treat acne in females. This medicine may be used for other purposes; ask your health care provider or pharmacist if you have questions. COMMON BRAND NAME(S): Aurovela [address] 1/20, Aurovela Fe, Blisovi [address], [address] Fe, [place] Fe, Gildess 24 Fe, Gildess Fe 1.5/30, Gildess Fe 1/20, Hailey 24 Fe, Junel Fe 1.5/30, Junel Fe 1/20, Junel Fe 24, Larin Fe, Lo Loestrin Fe, Loestrin 24 Fe, Loestrin FE 1.5/30, Loestrin FE 1/20, Lomedia 24 Fe, Microgestin 24 Fe, Microgestin Fe 1.5/30, Microgestin Fe 1/20, Tarina 24 Fe, Tarina Fe 1/20, Taytulla, Tilia Fe, Tri-Legest Fe What should I tell my health care provider before I take this medicine? They need to know if you have any of these conditions: -abnormal vaginal bleeding -blood vessel disease -breast, cervical, endometrial, ovarian, liver, or uterine cancer -diabetes -gallbladder disease -heart disease or recent heart attack -high blood pressure -high cholesterol -history of blood clots -kidney disease -liver disease -migraine headaches -smoke  tobacco -stroke -systemic lupus erythematosus (SLE) -an unusual or allergic reaction to estrogens, progestins, other medicines, foods, dyes, or preservatives -pregnant or trying to get pregnant -breast-feeding How should I use this medicine? Take this medicine by mouth. To reduce nausea, this medicine may be taken with food. Follow the directions on the prescription label. Take this medicine at the same time each day and in the order directed on the package. Do not take your medicine more often than directed. A patient package insert for the product will be given with each prescription and refill. Read this sheet carefully each time. The sheet may change frequently. Contact your pediatrician regarding the use of this medicine in children. Special care may be needed. This medicine has been used in female children who have started having menstrual periods. Overdosage: If you think you have taken too much of this medicine contact a poison control center or emergency room at once. NOTE: This medicine is only for you. Do not share this medicine with others. What if I miss a dose? If you miss a dose, refer to the patient information sheet you received with your medicine for direction. If you miss more than one pill, this medicine may not be as effective and you may need to use another form of birth control. What may interact with this medicine? Do not take this medicine with the following medication: -dasabuvir; ombitasvir; paritaprevir; ritonavir -ombitasvir; paritaprevir; ritonavir This medicine may also interact with the following medications: -acetaminophen -antibiotics or medicines for infections, especially rifampin, rifabutin, rifapentine, and griseofulvin, and possibly penicillins or tetracyclines -aprepitant -ascorbic acid (vitamin C) -atorvastatin -barbiturate medicines, such as  phenobarbital -bosentan -carbamazepine -caffeine -clofibrate -cyclosporine -dantrolene -doxercalciferol -felbamate -grapefruit juice -hydrocortisone -medicines for anxiety or sleeping problems, such as diazepam or temazepam -medicines for diabetes, including pioglitazone -mineral oil -modafinil -mycophenolate -nefazodone -oxcarbazepine -phenytoin -prednisolone -ritonavir or other medicines for HIV infection or AIDS -rosuvastatin -selegiline -soy isoflavones supplements -St. John's wort -tamoxifen or raloxifene -theophylline -thyroid hormones -topiramate -warfarin This list may not describe all possible interactions. Give your health care provider a list of all the medicines, herbs, non-prescription drugs, or dietary supplements you use. Also tell them if you smoke, drink alcohol, or use illegal drugs. Some items may interact with your medicine. What should I watch for while using this medicine? Visit your doctor or health care professional for regular checks on your progress. You will need a regular breast and pelvic exam and Pap smear while on this medicine. Use an additional method of contraception during the first cycle that you take these tablets. If you have any reason to think you are pregnant, stop taking this medicine right away and contact your doctor or health care professional. If you are taking this medicine for hormone related problems, it may take several cycles of use to see improvement in your condition. Smoking increases the risk of getting a blood clot or having a stroke while you are taking birth control pills, especially if you are more than 20 years old. You are strongly advised not to smoke. This medicine can make your body retain fluid, making your fingers, hands, or ankles swell. Your blood pressure can go up. Contact your doctor or health care professional if you feel you are retaining fluid. This medicine can make you more sensitive to the sun. Keep out  of the sun. If you cannot avoid being in the sun, wear protective clothing and use sunscreen. Do not use sun lamps or tanning beds/booths. If you wear contact lenses and notice visual changes, or if the lenses begin to feel uncomfortable, consult your eye care specialist. In some women, tenderness, swelling, or minor bleeding of the gums may occur. Notify your dentist if this happens. Brushing and flossing your teeth regularly may help limit this. See your dentist regularly and inform your dentist of the medicines you are taking. If you are going to have elective surgery, you may need to stop taking this medicine before the surgery. Consult your health care professional for advice. This medicine does not protect you against HIV infection (AIDS) or any other sexually transmitted diseases. What side effects may I notice from receiving this medicine? Side effects that you should report to your doctor or health care professional as soon as possible: -allergic reactions like skin rash, itching or hives, swelling of the face, lips, or tongue -breast tissue changes or discharge -changes in vaginal bleeding during your period or between your periods -changes in vision -chest pain -confusion -coughing up blood -dizziness -feeling faint or lightheaded -headaches or migraines -leg, arm or groin pain -loss of balance or coordination -severe or sudden headaches -stomach pain (severe) -sudden shortness of breath -sudden numbness or weakness of the face, arm or leg -symptoms of vaginal infection like itching, irritation or unusual discharge -tenderness in the upper abdomen -trouble speaking or understanding -vomiting -yellowing of the eyes or skin Side effects that usually do not require medical attention (report to your doctor or health care professional if they continue or are bothersome): -breakthrough bleeding and spotting that continues beyond the 3 initial cycles of pills -breast  tenderness -mood changes, anxiety, depression, frustration, anger,  or emotional outbursts -increased sensitivity to sun or ultraviolet light -nausea -skin rash, acne, or brown spots on the skin -weight gain (slight) This list may not describe all possible side effects. Call your doctor for medical advice about side effects. You may report side effects to FDA at 1-800-FDA-1088. Where should I keep my medicine? Keep out of the reach of children. Store at room temperature between 15 and 30 degrees C (59 and 86 degrees F). Throw away any unused medicine after the expiration date. NOTE: This sheet is a summary. It may not cover all possible information. If you have questions about this medicine, talk to your doctor, pharmacist, or health care provider.  2019 Elsevier/Gold Standard (2016-06-14 08:04:41)   Preventive Care 18-39 Years, Female Preventive care refers to lifestyle choices and visits with your health care provider that can promote health and wellness. What does preventive care include?   A yearly physical exam. This is also called an annual well check.  Dental exams once or twice a year.  Routine eye exams. Ask your health care provider how often you should have your eyes checked.  Personal lifestyle choices, including: ? Daily care of your teeth and gums. ? Regular physical activity. ? Eating a healthy diet. ? Avoiding tobacco and drug use. ? Limiting alcohol use. ? Practicing safe sex. ? Taking vitamin and mineral supplements as recommended by your health care provider. What happens during an annual well check? The services and screenings done by your health care provider during your annual well check will depend on your age, overall health, lifestyle risk factors, and family history of disease. Counseling Your health care provider may ask you questions about your:  Alcohol use.  Tobacco use.  Drug use.  Emotional well-being.  Home and relationship  well-being.  Sexual activity.  Eating habits.  Work and work Statistician.  Method of birth control.  Menstrual cycle.  Pregnancy history. Screening You may have the following tests or measurements:  Height, weight, and BMI.  Diabetes screening. This is done by checking your blood sugar (glucose) after you have not eaten for a while (fasting).  Blood pressure.  Lipid and cholesterol levels. These may be checked every 5 years starting at age 29.  Skin check.  Hepatitis C blood test.  Hepatitis B blood test.  Sexually transmitted disease (STD) testing.  BRCA-related cancer screening. This may be done if you have a family history of breast, ovarian, tubal, or peritoneal cancers.  Pelvic exam and Pap test. This may be done every 3 years starting at age 56. Starting at age 45, this may be done every 5 years if you have a Pap test in combination with an HPV test. Discuss your test results, treatment options, and if necessary, the need for more tests with your health care provider. Vaccines Your health care provider may recommend certain vaccines, such as:  Influenza vaccine. This is recommended every year.  Tetanus, diphtheria, and acellular pertussis (Tdap, Td) vaccine. You may need a Td booster every 10 years.  Varicella vaccine. You may need this if you have not been vaccinated.  HPV vaccine. If you are 60 or younger, you may need three doses over 6 months.  Measles, mumps, and rubella (MMR) vaccine. You may need at least one dose of MMR. You may also need a second dose.  Pneumococcal 13-valent conjugate (PCV13) vaccine. You may need this if you have certain conditions and were not previously vaccinated.  Pneumococcal polysaccharide (PPSV23) vaccine. You may need  one or two doses if you smoke cigarettes or if you have certain conditions.  Meningococcal vaccine. One dose is recommended if you are age 36-21 years and a first-year college student living in a residence  hall, or if you have one of several medical conditions. You may also need additional booster doses.  Hepatitis A vaccine. You may need this if you have certain conditions or if you travel or work in places where you may be exposed to hepatitis A.  Hepatitis B vaccine. You may need this if you have certain conditions or if you travel or work in places where you may be exposed to hepatitis B.  Haemophilus influenzae type b (Hib) vaccine. You may need this if you have certain risk factors. Talk to your health care provider about which screenings and vaccines you need and how often you need them. This information is not intended to replace advice given to you by your health care provider. Make sure you discuss any questions you have with your health care provider. Document Released: 11/30/2001 Document Revised: 05/17/2017 Document Reviewed: 08/05/2015 Elsevier Interactive Patient Education  2019 Elsevier Inc.   Breast Tenderness Breast tenderness is a common problem for women of all ages. Breast tenderness may cause mild discomfort to severe pain. The pain usually comes and goes in association with your menstrual cycle, but it can be constant. Breast tenderness has many possible causes, including hormone changes and some medicines. Your health care provider may order tests, such as a mammogram or an ultrasound, to check for any unusual findings. Having breast tenderness usually does not mean that you have breast cancer. Follow these instructions at home: Sometimes, reassurance that you do not have breast cancer is all that is needed. In general, follow these home care instructions: Managing pain and discomfort   If directed, apply ice to the area: ? Put ice in a plastic bag. ? Place a towel between your skin and the bag. ? Leave the ice on for 20 minutes, 2-3 times a day.  Make sure you are wearing a supportive bra, especially during exercise. You may also want to wear a supportive bra while  sleeping if your breasts are very tender. Medicines  Take over-the-counter and prescription medicines only as told by your health care provider. If the cause of your pain is infection, you may be prescribed an antibiotic medicine.  If you were prescribed an antibiotic, take it as told by your health care provider. Do not stop taking the antibiotic even if you start to feel better. General instructions   Your health care provider may recommend that you reduce the amount of fat in your diet. You can do this by: ? Limiting fried foods. ? Cooking foods using methods, such as baking, boiling, grilling, and broiling.  Decrease the amount of caffeine in your diet. You can do this by drinking more water and choosing caffeine-free options.  Keep a log of the days and times when your breasts are most tender.  Ask your health care provider how to do breast exams at home. This will help you notice if you have an unusual growth or lump. Contact a health care provider if:  Any part of your breast is hard, red, and hot to the touch. This may be a sign of infection.  You are not breastfeeding and you have fluid, especially blood or pus, coming out of your nipples.  You have a fever.  You have a new or painful lump in your breast  that remains after your menstrual period ends.  Your pain does not improve or it gets worse.  Your pain is interfering with your daily activities. This information is not intended to replace advice given to you by your health care provider. Make sure you discuss any questions you have with your health care provider. Document Released: 09/16/2008 Document Revised: 07/02/2016 Document Reviewed: 07/02/2016 Elsevier Interactive Patient Education  2019 [employer].

## 2018-12-25 NOTE — Progress Notes (Signed)
   GYNECOLOGY OFFICE ENCOUNTER NOTE  History:  18 y.o. 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   Reports intermittent cramping and vaginal bleeding since placement that varies from light to heavy every few days as well as breast tenderness. Previously used Depo Provera for four (4) years.   Denies difficulty breathing or respiratory distress, chest pain, dysuria, and leg pain or swelling.   The following portions of the patient's history were reviewed and updated as appropriate: allergies, current medications, past family history, past medical history, past social history, past surgical history and problem list.   Review of Systems:   Pertinent items are noted in HPI.  Objective:   Blood pressure 128/84, pulse 82, height 5\' 4"  (1.626 m), weight 125 lb 14.4 oz (57.1 kg), last menstrual period 11/10/2018.   Physical Exam  CONSTITUTIONAL: Well-developed, well-nourished female in no acute distress.   ABDOMEN: Soft, no distention noted.    PELVIC: Normal appearing external genitalia; normal appearing vaginal mucosa and cervix.  IUD strings visualized, about 3 cm in length outside cervix.   Depression screen [facility] 2/9 12/25/2018 10/10/2018 07/22/2017  Decreased Interest 0 2 0  Down, Depressed, Hopeless 0 1 0  PHQ - 2 Score 0 3 0  Altered sleeping 3 3 -  Tired, decreased energy 1 3 -  Change in appetite 3 3 -  Feeling bad or failure about yourself  0 2 -  Trouble concentrating 1 3 -  Moving slowly or fidgety/restless 0 0 -  Suicidal thoughts 0 1 -  PHQ-9 Score 8 18 -  Difficult doing work/chores Not difficult at all Somewhat difficult -   GAD 7 : Generalized Anxiety Score 12/25/2018 10/10/2018  Nervous, Anxious, on Edge 0 2  Control/stop worrying 0 3  Worry too much - different things 0 3  Trouble relaxing 0 2  Restless 0 0  Easily  annoyed or irritable 0 3  Afraid - awful might happen 0 3  Total GAD 7 Score 0 16  Anxiety Difficulty Not difficult at all Somewhat difficult   Assessment & Plan:   Patient to keep IUD in place for up to 10 years; can come in for removal if she desires pregnancy earlier or for any concerning side effects.  Encouraged routine health maintenance.   Sample of Taytulla given to help "reset" menses.   Reviewed red flag symptoms and when to call.   RTC x 1 year for ANNUAL EXAM or sooner if needed.    Diona Fanti, CNM [facility], [facility] 12/25/18 2:59 PM

## 2019-04-08 ENCOUNTER — Encounter: Payer: Self-pay | Admitting: Certified Nurse Midwife

## 2019-04-09 ENCOUNTER — Other Ambulatory Visit: Payer: Self-pay

## 2019-04-09 MED ORDER — NORETHIN ACE-ETH ESTRAD-FE 1-20 MG-MCG PO TABS: 1.0000 | ORAL_TABLET | Freq: Every day | ORAL | 11 refills | Status: DC

## 2019-04-09 MED ORDER — TAYTULLA 1-20 MG-MCG(24) PO CAPS: 1.0000 | ORAL_CAPSULE | Freq: Every day | ORAL | 6 refills | Status: DC

## 2019-04-13 ENCOUNTER — Ambulatory Visit: Payer: BC Managed Care – PPO | Admitting: Certified Nurse Midwife

## 2019-04-13 ENCOUNTER — Encounter: Payer: Self-pay | Admitting: Certified Nurse Midwife

## 2019-04-13 ENCOUNTER — Other Ambulatory Visit (HOSPITAL_COMMUNITY)
Admission: RE | Admit: 2019-04-13 | Discharge: 2019-04-13 | Disposition: A | Discharge status: Home / Self Care | Payer: BC Managed Care – PPO | Source: Ambulatory Visit | Attending: Certified Nurse Midwife | Admitting: Certified Nurse Midwife

## 2019-04-13 ENCOUNTER — Other Ambulatory Visit: Payer: Self-pay

## 2019-04-13 VITALS — BP 109/77 | HR 81 | Ht 64.25 in | Wt 125.0 lb

## 2019-04-13 DIAGNOSIS — Z202 Contact with and (suspected) exposure to infections with a predominantly sexual mode of transmission: Secondary | ICD-10-CM | POA: Insufficient documentation

## 2019-04-13 DIAGNOSIS — Z975 Presence of (intrauterine) contraceptive device: Secondary | ICD-10-CM | POA: Diagnosis present

## 2019-04-13 NOTE — Progress Notes (Signed)
Patient here for STD testing.

## 2019-04-13 NOTE — Progress Notes (Signed)
GYN ENCOUNTER NOTE  Subjective:       Margaret Dunn is a 18 y.o. G0P0000 female her for STD testing after condom failure on 03/23/2019.  Denies difficulty breathing or respiratory distress, chest pain, abdominal pain, excessive vaginal bleeding, dysuria, and leg pain or swelling.    Gynecologic History  No LMP recorded (lmp unknown). (Menstrual status: IUD).  Contraception: IUD, ParaGard  Last Pap: N/A.   Obstetric History  OB History  Gravida Para Term Preterm AB Living  0 0 0 0 0 0  SAB TAB Ectopic Multiple Live Births  0 0 0 0 0    Past Medical History:  Diagnosis Date  . Anxiety   . Iron deficiency anemia 10/22/2016  . Neutropenia ([place]) 10/22/2016  . 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 WISDOM TOOTH EXTRACTION      Current Outpatient Medications on File Prior to Visit  Medication Sig Dispense Refill  . norethindrone-ethinyl estradiol (JUNEL FE 1/20) 1-20 MG-MCG tablet Take 1 tablet by mouth daily. 1 Package 11  . PARAGARD INTRAUTERINE COPPER IU by Intrauterine route.    . Vitamin D, Ergocalciferol, (DRISDOL) 1.25 MG (50000 UT) CAPS capsule Take 1 capsule (50,000 Units total) by mouth every 7 (seven) days. 4 capsule 1   No current facility-administered medications on file prior to visit.     No Known Allergies  Social History   Socioeconomic History  . Marital status: Significant Other    Spouse name: [name]  . Number of children: Not on file  . Years of education: 69  . Highest education level: Some college, no degree  Occupational History  . Not on file  Social Needs  . Financial resource strain: Not hard at all  . Food insecurity    Worry: Never true    Inability: Never true  . Transportation needs    Medical: No    Non-medical: No  Tobacco Use  . Smoking status: Never Smoker  . Smokeless tobacco: Never Used  Substance and Sexual Activity  . Alcohol use: No  . Drug  use: No  . Sexual activity: Yes    Birth control/protection: I.U.D., Pill    Comment: Paragard  Lifestyle  . Physical activity    Days per week: 0 days    Minutes per session: 0 min  . Stress: Rather much  Relationships  . Social connections    Talks on phone: More than three times a week    Gets together: More than three times a week    Attends religious service: More than 4 times per year    Active member of club or organization: Yes    Attends meetings of clubs or organizations: Never    Relationship status: Never married  . Intimate partner violence    Fear of current or ex partner: No    Emotionally abused: No    Physically abused: No    Forced sexual activity: No  Other Topics Concern  . Not on file  Social History Narrative  . Not on file    Family History  Problem Relation Age of Onset  . Breast cancer Neg Hx   . Ovarian cancer Neg Hx   . Colon cancer Neg Hx     The following portions of the patient's history were reviewed and updated as appropriate: allergies, current medications, past family history, past medical history, past social history, past surgical history and problem list.  Review of Systems  ROS negative except as noted above. Information obtained from patient.   Objective:   BP 109/77   Pulse 81   Ht 5' 4.25" (1.632 m)   Wt 125 lb (56.7 kg)   LMP  (LMP Unknown)   BMI 21.29 kg/m    CONSTITUTIONAL: Well-developed, well-nourished female in no acute distress.   PELVIC:  External Genitalia: Normal  BUS: Normal  Vagina: Normal  Cervix: Normal, IUD string present, vaginal swab collected  MUSCULOSKELETAL: Normal range of motion. No tenderness.  No cyanosis, clubbing, or edema.  Assessment:   1. Exposure to STD  - RPR - Beta hCG quant (ref lab) - HIV Antibody (routine testing w rflx)     Plan:   Vaginal swab collected, see orders. Will contact patient with results.   Labs: see orders. Recommended repeat labs in 3 months.    Reviewed red flag symptoms and when to call.   RTC x 9 months for ANNUAL EXAM or sooner if needed.    Diona Fanti, CNM [facility], [facility] 04/13/19 12:06 PM

## 2019-04-13 NOTE — Patient Instructions (Signed)

## 2019-04-13 NOTE — Addendum Note (Signed)
Addended by: Cherre Huger on: 04/13/2019 12:26 PM   Modules accepted: Orders

## 2019-04-14 LAB — RPR: RPR Ser Ql: NONREACTIVE

## 2019-04-14 LAB — BETA HCG QUANT (REF LAB): hCG Quant: <1 m[IU]/mL

## 2019-04-14 LAB — HIV ANTIBODY (ROUTINE TESTING W REFLEX): HIV Screen 4th Generation wRfx: NONREACTIVE

## 2019-04-17 LAB — CERVICOVAGINAL ANCILLARY ONLY
Bacterial vaginitis: NEGATIVE
Candida vaginitis: POSITIVE — AB
Chlamydia: NEGATIVE
Neisseria Gonorrhea: NEGATIVE
Trichomonas: NEGATIVE

## 2019-04-19 ENCOUNTER — Encounter: Payer: Self-pay | Admitting: Certified Nurse Midwife

## 2019-04-19 ENCOUNTER — Other Ambulatory Visit: Payer: Self-pay

## 2019-04-19 MED ORDER — FLUCONAZOLE 150 MG PO TABS: 150.0000 mg | ORAL_TABLET | Freq: Once | ORAL | 0 refills | Status: AC

## 2019-08-08 ENCOUNTER — Encounter: Payer: Self-pay | Admitting: Certified Nurse Midwife

## 2019-08-22 ENCOUNTER — Encounter: Payer: Self-pay | Admitting: Certified Nurse Midwife

## 2019-08-30 ENCOUNTER — Encounter: Payer: BC Managed Care – PPO | Admitting: Certified Nurse Midwife

## 2019-10-15 ENCOUNTER — Ambulatory Visit (INDEPENDENT_AMBULATORY_CARE_PROVIDER_SITE_OTHER): Payer: BC Managed Care – PPO | Admitting: Family Medicine

## 2019-10-15 ENCOUNTER — Other Ambulatory Visit: Payer: Self-pay

## 2019-10-15 ENCOUNTER — Encounter: Payer: Self-pay | Admitting: Family Medicine

## 2019-10-15 VITALS — BP 120/80 | HR 82 | Temp 97.5°F | Resp 14 | Ht 65.0 in | Wt 113.0 lb

## 2019-10-15 DIAGNOSIS — D649 Anemia, unspecified: Secondary | ICD-10-CM

## 2019-10-15 DIAGNOSIS — F410 Panic disorder [episodic paroxysmal anxiety] without agoraphobia: Secondary | ICD-10-CM

## 2019-10-15 DIAGNOSIS — H6123 Impacted cerumen, bilateral: Secondary | ICD-10-CM

## 2019-10-15 DIAGNOSIS — R94120 Abnormal auditory function study: Secondary | ICD-10-CM

## 2019-10-15 DIAGNOSIS — Z020 Encounter for examination for admission to educational institution: Secondary | ICD-10-CM

## 2019-10-15 DIAGNOSIS — N760 Acute vaginitis: Secondary | ICD-10-CM

## 2019-10-15 DIAGNOSIS — Z111 Encounter for screening for respiratory tuberculosis: Secondary | ICD-10-CM

## 2019-10-15 DIAGNOSIS — E559 Vitamin D deficiency, unspecified: Secondary | ICD-10-CM

## 2019-10-15 DIAGNOSIS — Z0000 Encounter for general adult medical examination without abnormal findings: Secondary | ICD-10-CM | POA: Diagnosis not present

## 2019-10-15 DIAGNOSIS — Z23 Encounter for immunization: Secondary | ICD-10-CM | POA: Diagnosis not present

## 2019-10-15 DIAGNOSIS — F329 Major depressive disorder, single episode, unspecified: Secondary | ICD-10-CM

## 2019-10-15 NOTE — Progress Notes (Signed)
Patient: Margaret Dunn, Female    DOB: 08-18-2001, 18 y.o.   MRN: [id number] Delsa Grana, PA-C Visit Date: 10/15/2019  Today's Provider: Delsa Grana, PA-C   Chief Complaint  Patient presents with  . Annual Exam    with cpe form   Subjective:   Annual physical exam:  Margaret Dunn is a 18 y.o. female who presents today for complete physical exam:  Education and Employment:  School Status: college - doing well, doing Scientist, product/process development Work: working at [employer]  Smoking: no Secondhand smoke exposure? no Drugs/EtOH: denies   Sexuality:  -Menarche: normal - females:  last menses: IUD - Sexually active? yes - one female partner, boyfriend uses condoms  - sexual partners in last year: 1-2?  Pt not very clear on her answer - contraception use: condoms, IUD - Last STI Screening: June  - Violence/Abuse: denies   Mood: Suicidality and Depression: denies SI, self harm, HI, AVH, some anxiety and panic attacks   In addition, the following topics were discussed as part of anticipatory guidance healthy eating, exercise, seatbelt use, bullying, abuse/trauma, weapon use, tobacco use, marijuana use, drug use, condom use, birth control, sexuality, suicidality/self harm, mental health issues, social isolation, school problems, family problems and screen time.  PHQ-9 completed and results indicated - neg - see below   Exercise/Activity:  None right now other than walking on campus and physical work at job  Diet/nutrition:  Generally healthy, but often having stomach cramps secondary to IUD and some weightloss  Sleep: fairly well.  Pt wished to discuss acute complaints, addressed and pt will return for visit  USPSTF grade A and B recommendations - reviewed and addressed today  Depression:  Phq 9 completed today by patient, was reviewed by me with patient in the room, score is  negative, pt feels good, some anxiety PHQ 2/9 Scores 10/15/2019 12/25/2018 10/10/2018 07/22/2017  PHQ - 2  Score 0 0 3 0  PHQ- 9 Score 0 8 18 -   Depression screen [facility] 2/9 10/15/2019 12/25/2018 10/10/2018 07/22/2017  Decreased Interest 0 0 2 0  Down, Depressed, Hopeless 0 0 1 0  PHQ - 2 Score 0 0 3 0  Altered sleeping 0 3 3 -  Tired, decreased energy 0 1 3 -  Change in appetite 0 3 3 -  Feeling bad or failure about yourself  0 0 2 -  Trouble concentrating 0 1 3 -  Moving slowly or fidgety/restless 0 0 0 -  Suicidal thoughts 0 0 1 -  PHQ-9 Score 0 8 18 -  Difficult doing work/chores Not difficult at all Not difficult at all Somewhat difficult -   Alcohol screening:   [facility] Visit from 10/15/2019 in [facility]  AUDIT-C Score  0     Immunizations and Health Maintenance:  UTD, flu today Health Maintenance  Topic Date Due  . CHLAMYDIA SCREENING  04/12/2020  . TETANUS/TDAP  10/10/2028  . INFLUENZA VACCINE  Completed  . HIV Screening  Completed     Hep C Screening: not indicated  STD testing and prevention (HIV/chl/gon/syphilis): done, none needed today  Intimate partner violence:none  Sexual History/Pain during Intercourse:  No pain, sexually active with boyfried Significant Other  Menstrual History/LMP/Abnormal Bleeding: cramping - has OBGYN No LMP recorded. (Menstrual status: IUD).  Incontinence Symptoms: none  Breast cancer:  Last Mammogram: n/a BRCA gene screening: n/a  Cervical cancer screening: UTD Family hx of cancers - breast, ovarian, uterine, colon:  none  Osteoporosis:  Discussed high calcium and vitamin D supplementation, weight bearing exercises  Skin cancer:  Hx of skin CA -  NO Discussed atypical lesions   Colorectal cancer:   colonoscopy is not indicated currently    Lung cancer:   Low Dose CT Chest recommended if Age 54-80 years, 30 pack-year currently smoking OR have quit w/in 15years. Patient does not qualify.   Social History   Tobacco Use  . Smoking status: Never Smoker  . Smokeless tobacco: Never Used  Substance Use  Topics  . Alcohol use: No     ECG: none done today, not indicated  Blood pressure/Hypertension: BP Readings from Last 3 Encounters:  10/15/19 120/80  04/13/19 109/77  12/25/18 128/84    Weight/Obesity: Wt Readings from Last 3 Encounters:  10/15/19 113 lb (51.3 kg)  04/13/19 125 lb (56.7 kg)  12/25/18 125 lb 14.4 oz (57.1 kg) (45 %, Z= -0.11)*   * Growth percentiles are based on CDC (Girls, 2-20 Years) data.   BMI Readings from Last 3 Encounters:  10/15/19 18.80 kg/m  04/13/19 21.29 kg/m  12/25/18 21.61 kg/m (49 %, Z= -0.03)*   * Growth percentiles are based on CDC (Girls, 2-20 Years) data.     Lipids:  Lab Results  Component Value Date   CHOL 165 10/10/2018   Lab Results  Component Value Date   HDL 48 10/10/2018   Lab Results  Component Value Date   LDLCALC 103 10/10/2018   Lab Results  Component Value Date   TRIG 47 10/10/2018   Lab Results  Component Value Date   CHOLHDL 3.4 10/10/2018   No results found for: LDLDIRECT Based on the results of lipid panel his/her cardiovascular risk factor ( using [facility] )  in the next 10 years is: The ASCVD Risk score Mikey Bussing DC Jr., et al., 2013) failed to calculate for the following reasons:   The 2013 ASCVD risk score is only valid for ages 65 to 22  Glucose:  Glucose, Bld  Date Value Ref Range Status  10/10/2018 83 65 - 99 mg/dL Final    Comment:    .            Fasting reference interval .       Office Visit from 10/15/2019 in [facility]  AUDIT-C Score  0      Depression screen [facility] 2/9 10/15/2019 12/25/2018 10/10/2018 07/22/2017  Decreased Interest 0 0 2 0  Down, Depressed, Hopeless 0 0 1 0  PHQ - 2 Score 0 0 3 0  Altered sleeping 0 3 3 -  Tired, decreased energy 0 1 3 -  Change in appetite 0 3 3 -  Feeling bad or failure about yourself  0 0 2 -  Trouble concentrating 0 1 3 -  Moving slowly or fidgety/restless 0 0 0 -  Suicidal thoughts 0 0 1 -  PHQ-9 Score 0 8 18 -   Difficult doing work/chores Not difficult at all Not difficult at all Somewhat difficult -   Hypertension: BP Readings from Last 3 Encounters:  10/15/19 120/80  04/13/19 109/77  12/25/18 128/84   Obesity: Wt Readings from Last 3 Encounters:  10/15/19 113 lb (51.3 kg)  04/13/19 125 lb (56.7 kg)  12/25/18 125 lb 14.4 oz (57.1 kg) (45 %, Z= -0.11)*   * Growth percentiles are based on CDC (Girls, 2-20 Years) data.   BMI Readings from Last 3 Encounters:  10/15/19 18.80 kg/m  04/13/19 21.29 kg/m  12/25/18 21.61  kg/m (49 %, Z= -0.03)*   * Growth percentiles are based on CDC (Girls, 2-20 Years) data.    Social History      She  reports that she has never smoked. She has never used smokeless tobacco. She reports that she does not drink alcohol or use drugs.       Social History   Socioeconomic History  . Marital status: Significant Other    Spouse name: [name]  . Number of children: Not on file  . Years of education: 55  . Highest education level: Some college, no degree  Occupational History  . Not on file  Tobacco Use  . Smoking status: Never Smoker  . Smokeless tobacco: Never Used  Substance and Sexual Activity  . Alcohol use: No  . Drug use: No  . Sexual activity: Yes    Birth control/protection: I.U.D., Pill    Comment: Paragard  Other Topics Concern  . Not on file  Social History Narrative  . Not on file   Social Determinants of Health   Financial Resource Strain:   . Difficulty of Paying Living Expenses: Not on file  Food Insecurity:   . Worried About Charity fundraiser in the Last Year: Not on file  . Ran Out of Food in the Last Year: Not on file  Transportation Needs:   . Lack of Transportation (Medical): Not on file  . Lack of Transportation (Non-Medical): Not on file  Physical Activity:   . Days of Exercise per Week: Not on file  . Minutes of Exercise per Session: Not on file  Stress:   . Feeling of Stress : Not on file  Social Connections:   .  Frequency of Communication with Friends and Family: Not on file  . Frequency of Social Gatherings with Friends and Family: Not on file  . Attends Religious Services: Not on file  . Active Member of Clubs or Organizations: Not on file  . Attends Archivist Meetings: Not on file  . Marital Status: Not on file    Family History        Family Status  Relation Name Status  . Mother  Alive  . Father  Alive  . Sister  Alive  . MGM  Alive  . MGF  Other       unknown  . PGM  Alive  . PGF  Alive  . Sister  Alive  . Sister  Alive  . Sister  Alive  . Neg Hx  (Not Specified)        Her family history is negative for Breast cancer, Ovarian cancer, and Colon cancer.       Family History  Problem Relation Age of Onset  . Breast cancer Neg Hx   . Ovarian cancer Neg Hx   . Colon cancer Neg Hx     Patient Active Problem List   Diagnosis Date Noted  . IUD (intrauterine device) in place 12/25/2018  . Vitamin D deficiency 10/11/2018  . 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 Hip strain, right, initial encounter 10/21/2016  . Lower back pain 10/21/2016  . Failed hearing screening 07/15/2016    Past Surgical History:  Procedure Laterality Date  . WISDOM TOOTH EXTRACTION       Current Outpatient Medications:  .  PARAGARD INTRAUTERINE COPPER IU, by Intrauterine route., Disp: , Rfl:  .  Vitamin D, Ergocalciferol, (DRISDOL) 1.25 MG (50000 UT) CAPS capsule, Take 1 capsule (50,000 Units total) by mouth every 7 (  seven) days., Disp: 4 capsule, Rfl: 1  No Known Allergies  Patient Care Team: Delsa Grana, PA-C as PCP - General (Family Medicine) Gae Dry, MD as Referring Physician (Obstetrics and Gynecology)  Review of Systems  Constitutional: Negative.   HENT: Negative.   Eyes: Negative.   Respiratory: Negative.   Cardiovascular: Negative.   Gastrointestinal: Negative.   Endocrine: Negative.   Genitourinary: Negative.   Musculoskeletal: Negative.   Skin:  Negative.   Allergic/Immunologic: Negative.   Neurological: Negative.   Hematological: Negative.   Psychiatric/Behavioral: Negative.   All other systems reviewed and are negative.   I personally reviewed active problem list, medication list, allergies, family history, social history, health maintenance, notes from last encounter, lab results, imaging with the patient/caregiver today.        Objective:   Vitals:  Vitals:   10/15/19 1055  BP: 120/80  Pulse: 82  Resp: 14  Temp: (!) 97.5 F (36.4 C)  SpO2: 100%  Weight: 113 lb (51.3 kg)  Height: 5' 5"  (1.651 m)    Body mass index is 18.8 kg/m.  Physical Exam Vitals and nursing note reviewed.  Constitutional:      General: She is not in acute distress.    Appearance: Normal appearance. She is well-developed. She is not ill-appearing, toxic-appearing or diaphoretic.     Interventions: Face mask in place.  HENT:     Head: Normocephalic and atraumatic.     Right Ear: Decreased hearing noted. No swelling or tenderness. There is impacted cerumen. No mastoid tenderness.     Left Ear: No decreased hearing noted. No swelling or tenderness. There is impacted cerumen. No mastoid tenderness.  Eyes:     General: Lids are normal. No scleral icterus.       Right eye: No discharge.        Left eye: No discharge.     Conjunctiva/sclera: Conjunctivae normal.  Neck:     Trachea: Phonation normal. No tracheal deviation.  Cardiovascular:     Rate and Rhythm: Normal rate and regular rhythm.     Pulses: Normal pulses.          Radial pulses are 2+ on the right side and 2+ on the left side.       Posterior tibial pulses are 2+ on the right side and 2+ on the left side.     Heart sounds: Normal heart sounds. No murmur. No friction rub. No gallop.   Pulmonary:     Effort: Pulmonary effort is normal. No respiratory distress.     Breath sounds: Normal breath sounds. No stridor. No wheezing, rhonchi or rales.  Chest:     Chest wall: No  tenderness.  Abdominal:     General: Bowel sounds are normal. There is no distension.     Palpations: Abdomen is soft.     Tenderness: There is no abdominal tenderness. There is no guarding or rebound.  Musculoskeletal:        General: No deformity. Normal range of motion.     Cervical back: Normal range of motion and neck supple.     Right lower leg: No edema.     Left lower leg: No edema.  Lymphadenopathy:     Cervical: No cervical adenopathy.  Skin:    General: Skin is warm and dry.     Capillary Refill: Capillary refill takes less than 2 seconds.     Coloration: Skin is not jaundiced or pale.     Findings: No rash.  Neurological:     Mental Status: She is alert and oriented to person, place, and time.     Motor: No abnormal muscle tone.     Gait: Gait normal.  Psychiatric:        Speech: Speech normal.        Behavior: Behavior normal.       Fall Risk: Fall Risk  10/15/2019 10/10/2018 07/22/2017  Falls in the past year? 0 0 No  Number falls in past yr: 0 - -  Injury with Fall? 0 - -    Functional Status Survey: Is the patient deaf or have difficulty hearing?: No Does the patient have difficulty seeing, even when wearing glasses/contacts?: No Does the patient have difficulty concentrating, remembering, or making decisions?: No Does the patient have difficulty walking or climbing stairs?: No Does the patient have difficulty dressing or bathing?: No Does the patient have difficulty doing errands alone such as visiting a doctor's office or shopping?: No   Assessment & Plan:    CPE completed today  . USPSTF grade A and B recommendations reviewed with patient; age-appropriate recommendations, preventive care, screening tests, etc discussed and encouraged; healthy living encouraged; see AVS for patient education given to patient  . Discussed importance of 150 minutes of physical activity weekly, AHA exercise recommendations given to pt in AVS/handout  . Discussed  importance of healthy diet:  eating lean meats and proteins, avoiding trans fats and saturated fats, avoid simple sugars and excessive carbs in diet, eat 6 servings of fruit/vegetables daily and drink plenty of water and avoid sweet beverages.    . Recommended pt to do annual eye exam and routine dental exams/cleanings  . Depression, alcohol, fall screening completed as documented above and per flowsheets  . Reviewed Health Maintenance: Health Maintenance  Topic Date Due  . CHLAMYDIA SCREENING  04/12/2020  . TETANUS/TDAP  10/10/2028  . INFLUENZA VACCINE  Completed  . HIV Screening  Completed    . Immunizations: Immunization History  Administered Date(s) Administered  . Influenza,inj,Quad PF,6+ Mos 07/15/2016, 07/22/2017, 10/10/2018, 10/15/2019  . Tdap 10/10/2018      ICD-10-CM   1. Annual physical exam  Z00.00 Hearing screening    Visual acuity screening    CBC w/ Diff    CMP w GFR  2. Need for influenza vaccination  Z23 Flu Vaccine QUAD 6+ mos PF IM (Fluarix Quad PF)  3. Vitamin D deficiency  E55.9 Vit D  4. Anemia, unspecified type  D64.9 CBC w/ Diff   recheck labs  5. Acute vaginitis  N76.0    pt will f/up with sx, no pain, advised to tx with monistat OTC  6. Bilateral impacted cerumen  H61.23    pt instructed to use debrox drops OTC, 2x a day for 3-5 days and return for f/up OV to do cerumen disimpaction/irrigation  7. Failed hearing screening  R94.120    will tx cerumen impaction and recheck  8. Major depressive disorder with current active episode, unspecified depression episode severity, unspecified whether recurrent  F32.9    phq neg  9. Panic attack  F41.0    sx every couple weeks, previously referred to psych did not go, f/up when she comes back in a week or two with GAD7  10. Screening for tuberculosis  Z11.1 QuantiFERON-TB Gold Plus   for public school form/examination  11. School health examination  Z02.0    forms done today for [employer] health  examination, some areas not completed or failed,  pt will f/up and we will update forms      Orders Placed This Encounter  Procedures  . Flu Vaccine QUAD 6+ mos PF IM (Fluarix Quad PF)  . CBC w/ Diff  . CMP w GFR  . Vit D  . QuantiFERON-TB Gold Plus  . Visual acuity screening  . Hearing screening    Order Specific Question:   Where should this test be performed?    Answer:   Other       Delsa Grana, PA-C 10/15/19 12:53 PM  [facility]

## 2019-10-15 NOTE — Patient Instructions (Signed)
Preventive Care 18-21 Years Old, Female Preventive care refers to lifestyle choices and visits with your health care provider that can promote health and wellness. At this stage in your life, you may start seeing a primary care physician instead of a pediatrician. Your health care is now your responsibility. Preventive care for young adults includes:  A yearly physical exam. This is also called an annual wellness visit.  Regular dental and eye exams.  Immunizations.  Screening for certain conditions.  Healthy lifestyle choices, such as diet and exercise. What can I expect for my preventive care visit? Physical exam Your health care provider may check:  Height and weight. These may be used to calculate body mass index (BMI), which is a measurement that tells if you are at a healthy weight.  Heart rate and blood pressure.  Body temperature. Counseling Your health care provider may ask you questions about:  Past medical problems and family medical history.  Alcohol, tobacco, and drug use.  Home and relationship well-being.  Access to firearms.  Emotional well-being.  Diet, exercise, and sleep habits.  Sexual activity and sexual health.  Method of birth control.  Menstrual cycle.  Pregnancy history. What immunizations do I need?  Influenza (flu) vaccine  This is recommended every year. Tetanus, diphtheria, and pertussis (Tdap) vaccine  You may need a Td booster every 10 years. Varicella (chickenpox) vaccine  You may need this vaccine if you have not already been vaccinated. Human papillomavirus (HPV) vaccine  If recommended by your health care provider, you may need three doses over 6 months. Measles, mumps, and rubella (MMR) vaccine  You may need at least one dose of MMR. You may also need a second dose. Meningococcal conjugate (MenACWY) vaccine  One dose is recommended if you are 20-21 years old and a first-year college student living in a residence hall,  or if you have one of several medical conditions. You may also need additional booster doses. Pneumococcal conjugate (PCV13) vaccine  You may need this if you have certain conditions and were not previously vaccinated. Pneumococcal polysaccharide (PPSV23) vaccine  You may need one or two doses if you smoke cigarettes or if you have certain conditions. Hepatitis A vaccine  You may need this if you have certain conditions or if you travel or work in places where you may be exposed to hepatitis A. Hepatitis B vaccine  You may need this if you have certain conditions or if you travel or work in places where you may be exposed to hepatitis B. Haemophilus influenzae type b (Hib) vaccine  You may need this if you have certain risk factors. You may receive vaccines as individual doses or as more than one vaccine together in one shot (combination vaccines). Talk with your health care provider about the risks and benefits of combination vaccines. What tests do I need? Blood tests  Lipid and cholesterol levels. These may be checked every 5 years starting at age 20.  Hepatitis C test.  Hepatitis B test. Screening  Pelvic exam and Pap test. This may be done every 3 years starting at age 20.  Sexually transmitted disease (STD) testing, if you are at risk.  BRCA-related cancer screening. This may be done if you have a family history of breast, ovarian, tubal, or peritoneal cancers. Other tests  Tuberculosis skin test.  Vision and hearing tests.  Skin exam.  Breast exam. Follow these instructions at home: Eating and drinking   Eat a diet that includes fresh fruits and   vegetables, whole grains, lean protein, and low-fat dairy products.  Drink enough fluid to keep your urine pale yellow.  Do not drink alcohol if: ? Your health care provider tells you not to drink. ? You are pregnant, may be pregnant, or are planning to become pregnant. ? You are under the legal drinking age. In the  [place] the legal drinking age is 21.  If you drink alcohol: ? Limit how much you have to 0-1 drink a day. ? Be aware of how much alcohol is in your drink. In the [place] one drink equals one 12 oz bottle of beer (355 mL), one 5 oz glass of wine (148 mL), or one 1 oz glass of hard liquor (44 mL). Lifestyle  Take daily care of your teeth and gums.  Stay active. Exercise at least 30 minutes 5 or more days of the week.  Do not use any products that contain nicotine or tobacco, such as cigarettes, e-cigarettes, and chewing tobacco. If you need help quitting, ask your health care provider.  Do not use drugs.  If you are sexually active, practice safe sex. Use a condom or other form of birth control (contraception) in order to prevent pregnancy and STIs (sexually transmitted infections). If you plan to become pregnant, see your health care provider for a pre-conception visit.  Find healthy ways to cope with stress, such as: ? Meditation, yoga, or listening to music. ? Journaling. ? Talking to a trusted person. ? Spending time with friends and family. Safety  Always wear your seat belt while driving or riding in a vehicle.  Do not drive if you have been drinking alcohol. Do not ride with someone who has been drinking.  Do not drive when you are tired or distracted. Do not text while driving.  Wear a helmet and other protective equipment during sports activities.  If you have firearms in your house, make sure you follow all gun safety procedures.  Seek help if you have been bullied, physically abused, or sexually abused.  Use the Internet responsibly to avoid dangers such as online bullying and online sex predators. What's next?  Go to your health care provider once a year for a well check visit.  Ask your health care provider how often you should have your eyes and teeth checked.  Stay up to date on all vaccines. This information is not intended to replace advice given to you by  your health care provider. Make sure you discuss any questions you have with your health care provider. Document Released: 02/19/2016 Document Revised: 09/28/2018 Document Reviewed: 09/28/2018 Elsevier Patient Education  2020 [employer]  

## 2019-10-16 ENCOUNTER — Other Ambulatory Visit: Payer: Self-pay | Admitting: Family Medicine

## 2019-10-16 DIAGNOSIS — E559 Vitamin D deficiency, unspecified: Secondary | ICD-10-CM

## 2019-10-16 DIAGNOSIS — F329 Major depressive disorder, single episode, unspecified: Secondary | ICD-10-CM

## 2019-10-16 DIAGNOSIS — D649 Anemia, unspecified: Secondary | ICD-10-CM

## 2019-10-16 MED ORDER — VITAMIN D (ERGOCALCIFEROL) 1.25 MG (50000 UNIT) PO CAPS: 50000.0000 [IU] | ORAL_CAPSULE | ORAL | 0 refills | Status: DC

## 2019-10-17 ENCOUNTER — Encounter: Payer: Self-pay | Admitting: Family Medicine

## 2019-10-17 LAB — CBC WITH DIFFERENTIAL/PLATELET
Absolute Monocytes: 370 cells/uL (ref 200–950)
Basophils Absolute: 80 cells/uL (ref 0–200)
Basophils Relative: 1.9 %
Eosinophils Absolute: 139 cells/uL (ref 15–500)
Eosinophils Relative: 3.3 %
HCT: 33.9 % — ABNORMAL LOW (ref 35.0–45.0)
Hemoglobin: 11.2 g/dL — ABNORMAL LOW (ref 11.7–15.5)
Lymphs Abs: 2180 cells/uL (ref 850–3900)
MCH: 29.5 pg (ref 27.0–33.0)
MCHC: 33 g/dL (ref 32.0–36.0)
MCV: 89.2 fL (ref 80.0–100.0)
MPV: 9.4 fL (ref 7.5–12.5)
Monocytes Relative: 8.8 %
Neutro Abs: 1432 cells/uL — ABNORMAL LOW (ref 1500–7800)
Neutrophils Relative %: 34.1 %
Platelets: 341 10*3/uL (ref 140–400)
RBC: 3.8 10*6/uL (ref 3.80–5.10)
RDW: 11.8 % (ref 11.0–15.0)
Total Lymphocyte: 51.9 %
WBC: 4.2 10*3/uL (ref 3.8–10.8)

## 2019-10-17 LAB — COMPLETE METABOLIC PANEL WITH GFR
AG Ratio: 1.8 (calc) (ref 1.0–2.5)
ALT: 12 U/L (ref 6–29)
AST: 18 U/L (ref 10–30)
Albumin: 4.3 g/dL (ref 3.6–5.1)
Alkaline phosphatase (APISO): 61 U/L (ref 31–125)
BUN: 10 mg/dL (ref 7–25)
CO2: 27 mmol/L (ref 20–32)
Calcium: 9.2 mg/dL (ref 8.6–10.2)
Chloride: 105 mmol/L (ref 98–110)
Creat: 0.7 mg/dL (ref 0.50–1.10)
GFR, Est African American: 145 mL/min/{1.73_m2} (ref >=60)
GFR, Est Non African American: 125 mL/min/{1.73_m2} (ref >=60)
Globulin: 2.4 g/dL (calc) (ref 1.9–3.7)
Glucose, Bld: 82 mg/dL (ref 65–99)
Potassium: 4.5 mmol/L (ref 3.5–5.3)
Sodium: 139 mmol/L (ref 135–146)
Total Bilirubin: 0.2 mg/dL (ref 0.2–1.2)
Total Protein: 6.7 g/dL (ref 6.1–8.1)

## 2019-10-17 LAB — QUANTIFERON-TB GOLD PLUS
Mitogen-NIL: >10 IU/mL
NIL: 0.05 IU/mL
QuantiFERON-TB Gold Plus: NEGATIVE
TB1-NIL: 0.05 IU/mL
TB2-NIL: 0.04 IU/mL

## 2019-10-17 LAB — VITAMIN D 25 HYDROXY (VIT D DEFICIENCY, FRACTURES): Vit D, 25-Hydroxy: 20 ng/mL — ABNORMAL LOW (ref 30–100)

## 2019-10-23 ENCOUNTER — Encounter: Payer: Self-pay | Admitting: Family Medicine

## 2019-10-23 ENCOUNTER — Ambulatory Visit (INDEPENDENT_AMBULATORY_CARE_PROVIDER_SITE_OTHER): Payer: BC Managed Care – PPO | Admitting: Family Medicine

## 2019-10-23 ENCOUNTER — Other Ambulatory Visit: Payer: Self-pay

## 2019-10-23 VITALS — BP 98/64 | HR 89 | Temp 96.9°F | Resp 12 | Ht 65.0 in | Wt 111.2 lb

## 2019-10-23 DIAGNOSIS — R109 Unspecified abdominal pain: Secondary | ICD-10-CM

## 2019-10-23 DIAGNOSIS — M461 Sacroiliitis, not elsewhere classified: Secondary | ICD-10-CM | POA: Diagnosis not present

## 2019-10-23 DIAGNOSIS — H6123 Impacted cerumen, bilateral: Secondary | ICD-10-CM

## 2019-10-23 DIAGNOSIS — M5441 Lumbago with sciatica, right side: Secondary | ICD-10-CM

## 2019-10-23 DIAGNOSIS — N921 Excessive and frequent menstruation with irregular cycle: Secondary | ICD-10-CM | POA: Diagnosis not present

## 2019-10-23 DIAGNOSIS — M5442 Lumbago with sciatica, left side: Secondary | ICD-10-CM

## 2019-10-23 DIAGNOSIS — R634 Abnormal weight loss: Secondary | ICD-10-CM

## 2019-10-23 DIAGNOSIS — R94120 Abnormal auditory function study: Secondary | ICD-10-CM

## 2019-10-23 DIAGNOSIS — D649 Anemia, unspecified: Secondary | ICD-10-CM

## 2019-10-23 MED ORDER — PREDNISONE 20 MG PO TABS: ORAL_TABLET | ORAL | 0 refills | Status: DC

## 2019-10-23 NOTE — Progress Notes (Signed)
Name: Margaret Dunn   MRN: [id number]    DOB: 11-25-2000   Date:10/23/2019       Progress Note  Chief Complaint  Patient presents with  . Back Pain    years, lifts at work [employer], pain radiates down leg  . Cerumen Impaction  . Menstrual Problem    bleeding 2x per month with heavy bleeding had IUD     Subjective:   Margaret Dunn is a 19 y.o. female, presents to clinic for routine follow up on the conditions listed above.  Bilateral cerumen impaction and failed hearing screening - irrigated today, did debrox over the past week.  Patient denies any change in hearing and denies any ear pain, she did not attempt to irrigate at home.  Vaginitis from last week is better without treatment, hx of recurrent vaginitis/yeast infections.  She does report problems with her period.   AUB - frequent/irregular periods, shorted 3 days and then really heavy bleeding.  At previous encounter patient reported having IUD and having abdominal pain and cramping ever since the IUD was put in she previously also stated that it did help her periods improved slightly but that she was losing a lot of weight secondary to the abdominal cramping.  Today she reports that she has had heavy bleeding on and off very irregular sometimes 3 days and sometimes for longer week or 2 and this has been going on for the past 2 months.  Patient's physical and labs last week revealed that she has mild anemia with hemoglobin of 11.2.  She also has low vitamin D which she had a history of.  She previously did not report heavy periods but reported improving periods with IUD so anemia was worked up with labs including iron panel B12 folate and thyroid labs.  Her labs are also done because of her unintentional weight loss of 20- 15 lbs.  Wt Readings from Last 10 Encounters:  10/23/19 111 lb 3.2 oz (50.4 kg)  10/15/19 113 lb (51.3 kg)  04/13/19 125 lb (56.7 kg)  12/25/18 125 lb 14.4 oz (57.1 kg) (45 %, Z= -0.11)*  10/23/18 121 lb  (54.9 kg) (36 %, Z= -0.35)*  10/20/18 121 lb (54.9 kg) (36 %, Z= -0.35)*  10/10/18 118 lb 6.4 oz (53.7 kg) (31 %, Z= -0.50)*  07/22/17 129 lb 1.6 oz (58.6 kg) (58 %, Z= 0.19)*  10/21/16 119 lb 9 oz (54.2 kg) (42 %, Z= -0.20)*  07/15/16 114 lb 8 oz (51.9 kg) (32 %, Z= -0.46)*   * Growth percentiles are based on CDC (Girls, 2-20 Years) data.   She was asked to return to discuss further depression and anxiety symptoms but she does not want to discuss that today and complains of back pain with a history of lumbar radiculopathy and sacroiliitis for the past 2 to 3 years.  She states that she works at a [employer] and heavy lifting has caused gradual worsening of her low back pain that intermittently radiates down both of her legs.  She currently does not have any numbness, weakness, saddle anesthesia, incontinence of urine or stool.   Patient Active Problem List   Diagnosis Date Noted  . IUD (intrauterine device) in place 12/25/2018  . Vitamin D deficiency 10/11/2018  . 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[place]) 10/21/2016  . Hip strain, right, initial encounter 10/21/2016  . Lower back pain 10/21/2016  . Failed hearing screening 07/15/2016    Past Surgical History:  Procedure Laterality Date  . WISDOM TOOTH EXTRACTION  Family History  Problem Relation Age of Onset  . Breast cancer Neg Hx   . Ovarian cancer Neg Hx   . Colon cancer Neg Hx     Social History   Socioeconomic History  . Marital status: Significant Other    Spouse name: Margaret Dunn  . Number of children: Not on file  . Years of education: 20  . Highest education level: Some college, no degree  Occupational History  . Not on file  Tobacco Use  . Smoking status: Never Smoker  . Smokeless tobacco: Never Used  Substance and Sexual Activity  . Alcohol use: No  . Drug use: No  . Sexual activity: Yes    Birth control/protection: I.U.D., Pill    Comment: Paragard  Other Topics Concern  . Not on file  Social History  Narrative  . Not on file   Social Determinants of Health   Financial Resource Strain:   . Difficulty of Paying Living Expenses: Not on file  Food Insecurity:   . Worried About Charity fundraiser in the Last Year: Not on file  . Ran Out of Food in the Last Year: Not on file  Transportation Needs:   . Lack of Transportation (Medical): Not on file  . Lack of Transportation (Non-Medical): Not on file  Physical Activity:   . Days of Exercise per Week: Not on file  . Minutes of Exercise per Session: Not on file  Stress:   . Feeling of Stress : Not on file  Social Connections:   . Frequency of Communication with Friends and Family: Not on file  . Frequency of Social Gatherings with Friends and Family: Not on file  . Attends Religious Services: Not on file  . Active Member of Clubs or Organizations: Not on file  . Attends Archivist Meetings: Not on file  . Marital Status: Not on file  Intimate Partner Violence:   . Fear of Current or Ex-Partner: Not on file  . Emotionally Abused: Not on file  . Physically Abused: Not on file  . Sexually Abused: Not on file     Current Outpatient Medications:  .  PARAGARD INTRAUTERINE COPPER IU, by Intrauterine route., Disp: , Rfl:  .  Vitamin D, Ergocalciferol, (DRISDOL) 1.25 MG (50000 UT) CAPS capsule, Take 1 capsule (50,000 Units total) by mouth every 7 (seven) days., Disp: 14 capsule, Rfl: 0  No Known Allergies  I personally reviewed active problem list, medication list, allergies, family history, social history, health maintenance, notes from last encounter, lab results, imaging with the patient/caregiver today.  Review of Systems  Constitutional: Negative.   HENT: Negative.   Eyes: Negative.   Respiratory: Negative.   Cardiovascular: Negative.   Gastrointestinal: Negative.   Endocrine: Negative.   Genitourinary: Negative.   Musculoskeletal: Negative.   Skin: Negative.   Allergic/Immunologic: Negative.   Neurological:  Negative.   Hematological: Negative.   Psychiatric/Behavioral: Negative.   All other systems reviewed and are negative.    Objective:    Vitals:   10/23/19 0945  BP: 98/64  Pulse: 89  Resp: 12  Temp: (!) 96.9 F (36.1 C)  SpO2: 94%  Weight: 111 lb 3.2 oz (50.4 kg)  Height: 5\' 5"  (1.651 m)    Body mass index is 18.5 kg/m.  Physical Exam Vitals and nursing note reviewed.  Constitutional:      General: She is not in acute distress.    Appearance: Normal appearance. She is well-developed. She is not ill-appearing, toxic-appearing  or diaphoretic.     Interventions: Face mask in place.  HENT:     Head: Normocephalic and atraumatic.     Right Ear: Ear canal and external ear normal. There is no impacted cerumen.     Left Ear: Ear canal and external ear normal. There is no impacted cerumen.     Ears:     Comments: Cerumen impaction bilaterally was successfully removed with procedure, bilateral canals with mild erythema, bilateral TMs with some mild dullness, both TMs intact, no injection or erythema, left TM has some visible bubbles unclear if it is superficial to or deep to TM due to recent procedure Eyes:     General: Lids are normal. No scleral icterus.       Right eye: No discharge.        Left eye: No discharge.     Conjunctiva/sclera: Conjunctivae normal.  Neck:     Trachea: Phonation normal. No tracheal deviation.  Cardiovascular:     Rate and Rhythm: Normal rate and regular rhythm.     Pulses: Normal pulses.          Radial pulses are 2+ on the right side and 2+ on the left side.       Posterior tibial pulses are 2+ on the right side and 2+ on the left side.     Heart sounds: Normal heart sounds. No murmur. No friction rub. No gallop.   Pulmonary:     Effort: Pulmonary effort is normal. No respiratory distress.     Breath sounds: Normal breath sounds. No stridor. No wheezing, rhonchi or rales.  Chest:     Chest wall: No tenderness.  Abdominal:     General: Bowel  sounds are normal. There is no distension.     Palpations: Abdomen is soft.     Tenderness: There is no abdominal tenderness. There is no guarding or rebound.  Musculoskeletal:        General: No deformity. Normal range of motion.     Cervical back: Normal range of motion and neck supple.     Right lower leg: No edema.     Left lower leg: No edema.     Comments: No midline tenderness from cervical to lumbar spine, no step off No paraspinal muscle ttp from cervical to lumbar spine  Grossly normal ROM of back 5/5 strength bilaterally with dorsiflexion, plantarflexion, flexion and extension at knees, and flexion and extension at hips Grossly normal sensation to light touch to bilateral lower extremities Normal gait   Lymphadenopathy:     Cervical: No cervical adenopathy.  Skin:    General: Skin is warm and dry.     Capillary Refill: Capillary refill takes less than 2 seconds.     Coloration: Skin is not jaundiced or pale.     Findings: No rash.  Neurological:     Mental Status: She is alert and oriented to person, place, and time.     Motor: No abnormal muscle tone.     Gait: Gait normal.  Psychiatric:        Speech: Speech normal.        Behavior: Behavior normal.      Procedure: Indication: Cerumen impaction of the ear(s), decreased hearing and failed hearing screening  Medical necessity statement: On physical examination, cerumen impairs clinically significant portions of the external auditory canal, and tympanic membrane. Noted obstructive, copious cerumen that cannot be removed without magnification and instrumentations requiring MD/APP skills/procedure  Consent: Discussed benefits and risks  of procedure and verbal consent obtained  Procedure:  Cerumen Disimpaction Patient was prepped for the procedure.  Utilized an otoscope to assess and take note of the ear canal, the tympanic membrane, and the presence, amount, and placement of the cerumen.  Gentle ear lavage with warm  water and hydrogen peroxide performed on to bilateral ear.    Post procedure examination: shows cerumen was completely removed bilaterally. Patient tolerated procedure well.  There were no complications and following the disimpaction the tympanic membrane was  Visible bilatearlly, TM intact.   Auditory canals are inflamed mildly Pt tolerated procedure, reported some improvement of symptoms after removal of cerumen.   The patient is made aware that they may experience temporary vertigo, temporary hearing loss, and temporary discomfort.  If these symptom last for more than 24 hours to follow up in clinic.   Recent Results (from the past 2160 hour(s))  CBC w/ Diff     Status: Abnormal   Collection Time: 10/15/19 12:00 AM  Result Value Ref Range   WBC 4.2 3.8 - 10.8 Thousand/uL   RBC 3.80 3.80 - 5.10 Million/uL   Hemoglobin 11.2 (L) 11.7 - 15.5 g/dL   HCT 33.9 (L) 35.0 - 45.0 %   MCV 89.2 80.0 - 100.0 fL   MCH 29.5 27.0 - 33.0 pg   MCHC 33.0 32.0 - 36.0 g/dL   RDW 11.8 11.0 - 15.0 %   Platelets 341 140 - 400 Thousand/uL   MPV 9.4 7.5 - 12.5 fL   Neutro Abs 1,432 (L) 1,500 - 7,800 cells/uL   Lymphs Abs 2,180 850 - 3,900 cells/uL   Absolute Monocytes 370 200 - 950 cells/uL   Eosinophils Absolute 139 15 - 500 cells/uL   Basophils Absolute 80 0 - 200 cells/uL   Neutrophils Relative % 34.1 %   Total Lymphocyte 51.9 %   Monocytes Relative 8.8 %   Eosinophils Relative 3.3 %   Basophils Relative 1.9 %  CMP w GFR     Status: None   Collection Time: 10/15/19 12:00 AM  Result Value Ref Range   Glucose, Bld 82 65 - 99 mg/dL    Comment: .            Fasting reference interval .    BUN 10 7 - 25 mg/dL   Creat 0.70 0.50 - 1.10 mg/dL   GFR, Est Non African American 125 > OR = 60 mL/min/1.63m2   GFR, Est African American 145 > OR = 60 mL/min/1.33m2   BUN/Creatinine Ratio NOT APPLICABLE 6 - 22 (calc)   Sodium 139 135 - 146 mmol/L   Potassium 4.5 3.5 - 5.3 mmol/L   Chloride 105 98 - 110  mmol/L   CO2 27 20 - 32 mmol/L   Calcium 9.2 8.6 - 10.2 mg/dL   Total Protein 6.7 6.1 - 8.1 g/dL   Albumin 4.3 3.6 - 5.1 g/dL   Globulin 2.4 1.9 - 3.7 g/dL (calc)   AG Ratio 1.8 1.0 - 2.5 (calc)   Total Bilirubin 0.2 0.2 - 1.2 mg/dL   Alkaline phosphatase (APISO) 61 31 - 125 U/L   AST 18 10 - 30 U/L   ALT 12 6 - 29 U/L  Vit D     Status: Abnormal   Collection Time: 10/15/19 12:00 AM  Result Value Ref Range   Vit D, 25-Hydroxy 20 (L) 30 - 100 ng/mL    Comment: Vitamin D Status         25-OH Vitamin D: .  Deficiency:                    <20 ng/mL Insufficiency:             20 - 29 ng/mL Optimal:                 > or = 30 ng/mL . For 25-OH Vitamin D testing on patients on  D2-supplementation and patients for whom quantitation  of D2 and D3 fractions is required, the QuestAssureD(TM) 25-OH VIT D, (D2,D3), LC/MS/MS is recommended: order  code [id number] (patients >38yrs). See Note 1 . Note 1 . For additional information, please refer to  http://education.[facility].com/faq/FAQ199  (This link is being provided for informational/ educational purposes only.)   QuantiFERON-TB Gold Plus     Status: None   Collection Time: 10/15/19 12:00 AM  Result Value Ref Range   QuantiFERON-TB Gold Plus NEGATIVE NEGATIVE    Comment: Negative test result. M. tuberculosis complex  infection unlikely.    NIL 0.05 IU/mL   Mitogen-NIL >10.00 IU/mL   TB1-NIL 0.05 IU/mL   TB2-NIL 0.04 IU/mL    Comment: . The Nil tube value reflects the background interferon gamma immune response of the patient's blood sample. This value has been subtracted from the patient's displayed TB and Mitogen results. . Lower than expected results with the Mitogen tube prevent false-negative Quantiferon readings by detecting a patient with a potential immune suppressive condition and/or suboptimal pre-analytical specimen handling. . The TB1 Antigen tube is coated with the M. tuberculosis-specific antigens designed to  elicit responses from TB antigen primed CD4+ helper T-lymphocytes. . The TB2 Antigen tube is coated with the M. tuberculosis-specific antigens designed to elicit responses from TB antigen primed CD4+ helper and CD8+ cytotoxic T-lymphocytes. . For additional information, please refer to https://education.[facility].com/faq/FAQ204 (This link is being provided for informational/ educational purposes only.) .      PHQ2/9: Depression screen [facility] 2/9 10/23/2019 10/15/2019 12/25/2018 10/10/2018 07/22/2017  Decreased Interest 0 0 0 2 0  Down, Depressed, Hopeless 0 0 0 1 0  PHQ - 2 Score 0 0 0 3 0  Altered sleeping 0 0 3 3 -  Tired, decreased energy 0 0 1 3 -  Change in appetite 0 0 3 3 -  Feeling bad or failure about yourself  0 0 0 2 -  Trouble concentrating 0 0 1 3 -  Moving slowly or fidgety/restless 0 0 0 0 -  Suicidal thoughts 0 0 0 1 -  PHQ-9 Score 0 0 8 18 -  Difficult doing work/chores Not difficult at all Not difficult at all Not difficult at all Somewhat difficult -    phq 9 is neg - reviewed Fall Risk: Fall Risk  10/23/2019 10/15/2019 10/10/2018 07/22/2017  Falls in the past year? 0 0 0 No  Number falls in past yr: 0 0 - -  Injury with Fall? 0 0 - -      Functional Status Survey: Is the patient deaf or have difficulty hearing?: No Does the patient have difficulty seeing, even when wearing glasses/contacts?: No Does the patient have difficulty concentrating, remembering, or making decisions?: No Does the patient have difficulty walking or climbing stairs?: No Does the patient have difficulty dressing or bathing?: No Does the patient have difficulty doing errands alone such as visiting a doctor's office or shopping?: No    Assessment & Plan:     1. Bilateral impacted cerumen Successful procedure b/l, recheck of hearing requested but not done (?)  Instructed patient to avoid Q-tip use, use antihistamines and continue to use Debrox drops and gentle irrigation at  home every 1 to 2 months to prevent recurrence of total impaction, occurs to follow-up if any concerns side effects or failure to return to normal hearing - Ear Lavage  2. Bilateral low back pain with bilateral sciatica, unspecified chronicity Discussed gentle stretching, heat therapy, frequent position changing and will treat with a short course of steroids, encouraged her to do physical therapy for evaluation may need a spine specialist encouraged her to follow-up in 1 to 2 months - [facility]; Future - predniSONE (DELTASONE) 20 MG tablet; 2 tabs poqday 1-3, 1 tabs poqday 4-6  Dispense: 9 tablet; Refill: 0 - Ambulatory referral to Physical Therapy  3. 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  Inflammatory or rheumatic condition?  At f/up will discuss with pt and consider rheumatology referral?   - [facility]; Future - predniSONE (DELTASONE) 20 MG tablet; 2 tabs poqday 1-3, 1 tabs poqday 4-6  Dispense: 9 tablet; Refill: 0 - Ambulatory referral to Physical Therapy  4. Menorrhagia with irregular cycle - Ambulatory referral to Obstetrics / Gynecology  5. Anemia, unspecified type - Ambulatory referral to Obstetrics / Gynecology Labs pending - put in previously - including iron panel, B12, folate and TSH  6. Failed hearing screening Will recheck at next wellness  Weight loss and abdominal pain and cramping, she is very difficult historian very reluctant to complain of things but then she has a lot of complaints and does not provide very much history.  History is also unclear and patient is new to me.  Do believe she has an IUD which GYN had given her for heavy periods and 1 week ago she reported improved periods with the IUD and this week she complains of 2 months of severe periods that are regular with  heavy bleeding she also complains of cramping with her IUD in.  She has asked both visits if there is any medication she can take to help her gain weight because she is having difficulty with her appetite and with unintentional weight loss.  She describes her symptoms as generalized cramping when she eats but this is also the same cramping that she attributes to her IUD.  She denies any acid reflux, bloating, melena, constipation.    No follow-ups on file.   Delsa Grana, PA-C 10/23/19 10:11 AM

## 2019-10-24 ENCOUNTER — Encounter: Payer: Self-pay | Admitting: Family Medicine

## 2019-10-24 ENCOUNTER — Other Ambulatory Visit: Payer: Self-pay | Admitting: Family Medicine

## 2019-10-24 DIAGNOSIS — R634 Abnormal weight loss: Secondary | ICD-10-CM

## 2019-10-24 DIAGNOSIS — R1084 Generalized abdominal pain: Secondary | ICD-10-CM

## 2019-10-24 DIAGNOSIS — D509 Iron deficiency anemia, unspecified: Secondary | ICD-10-CM

## 2019-10-24 LAB — B12 AND FOLATE PANEL
Folate: 11.8 ng/mL
Vitamin B-12: 839 pg/mL (ref 200–1100)

## 2019-10-24 LAB — T4, FREE: Free T4: 1.2 ng/dL (ref 0.8–1.4)

## 2019-10-24 LAB — TSH: TSH: 1.59 mIU/L

## 2019-10-24 LAB — IRON,TIBC AND FERRITIN PANEL
%SAT: 23 % (calc) (ref 16–45)
Ferritin: 10 ng/mL — ABNORMAL LOW (ref 16–154)
Iron: 95 ug/dL (ref 40–190)
TIBC: 418 mcg/dL (calc) (ref 250–450)

## 2019-10-26 ENCOUNTER — Encounter: Payer: Self-pay | Admitting: *Deleted

## 2019-12-26 NOTE — Telephone Encounter (Signed)
Per 10/20/2018 visit note, patient was counseled and r/s to return for IUD placement 10/23/2018. Paragard rcvd/charged 10/23/2018

## 2020-07-15 ENCOUNTER — Encounter: Payer: Self-pay | Admitting: Family Medicine

## 2020-07-17 NOTE — Progress Notes (Signed)
Patient ID: Margaret Dunn, female    DOB: [birth year], 19 y.o.   MRN: [id number]  PCP: Delsa Grana, PA-C  Chief Complaint  Patient presents with  . Referral    GI refferal vor nausea and vomiting    Subjective:   Margaret Dunn is a 19 y.o. female, presents to clinic with CC of the following:  Abdominal Pain This is a new problem. The current episode started 1 to 4 weeks ago. The onset quality is sudden. The problem occurs intermittently. Pain location: mid to lower abdomen generalized. The pain is at a severity of 10/10 (at its worst last week was 10/10, currently 1/10). The abdominal pain does not radiate. Associated symptoms include anorexia, belching, constipation, diarrhea, flatus, nausea and vomiting. Pertinent negatives include no arthralgias, dysuria, fever, frequency, headaches, hematochezia, hematuria, melena, myalgias or weight loss. The pain is aggravated by eating and palpation. The pain is relieved by nothing. She has tried nothing (heating pad) for the symptoms. The treatment provided no relief. There is no history of abdominal surgery, Crohn's disease, gallstones, GERD, irritable bowel syndrome or PUD.      Patient Active Problem List   Diagnosis Date Noted  . IUD (intrauterine device) in place 12/25/2018  . Vitamin D deficiency 10/11/2018  . 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[place]) 10/21/2016  . Hip strain, right, initial encounter 10/21/2016  . Lower back pain 10/21/2016  . Failed hearing screening 07/15/2016      Current Outpatient Medications:  .  PARAGARD INTRAUTERINE COPPER IU, by Intrauterine route., Disp: , Rfl:  .  predniSONE (DELTASONE) 20 MG tablet, 2 tabs poqday 1-3, 1 tabs poqday 4-6 (Patient not taking: Reported on 07/18/2020), Disp: 9 tablet, Rfl: 0 .  Vitamin D, Ergocalciferol, (DRISDOL) 1.25 MG (50000 UT) CAPS capsule, Take 1 capsule (50,000 Units total) by mouth every 7 (seven) days. (Patient not taking: Reported on 07/18/2020), Disp: 14 capsule, Rfl:  0   No Known Allergies   Social History   Tobacco Use  . Smoking status: Never Smoker  . Smokeless tobacco: Never Used  Vaping Use  . Vaping Use: Never used  Substance Use Topics  . Alcohol use: No  . Drug use: No      Chart Review Today: I personally reviewed active problem list, medication list, allergies, family history, social history, health maintenance, notes from last encounter, lab results, imaging with the patient/caregiver today.   Review of Systems  Constitutional: Negative.  Negative for fever and weight loss.  HENT: Negative.   Eyes: Negative.   Respiratory: Negative.   Cardiovascular: Negative.   Gastrointestinal: Positive for abdominal pain, anorexia, constipation, diarrhea, flatus, nausea and vomiting. Negative for hematochezia and melena.  Endocrine: Negative.   Genitourinary: Negative.  Negative for dysuria, frequency and hematuria.  Musculoskeletal: Negative.  Negative for arthralgias and myalgias.  Skin: Negative.   Allergic/Immunologic: Negative.   Neurological: Negative.  Negative for headaches.  Hematological: Negative.   Psychiatric/Behavioral: Negative.   All other systems reviewed and are negative.      Objective:   Vitals:   07/18/20 0944  BP: 118/80  Pulse: 96  Resp: 16  Temp: 98.2 F (36.8 C)  TempSrc: Oral  SpO2: 99%  Weight: 115 lb 6.4 oz (52.3 kg)  Height: 5\' 5"  (1.651 m)    Body mass index is 19.2 kg/m.  Physical Exam Vitals and nursing note reviewed.  Constitutional:      General: She is not in acute distress.    Appearance: Normal appearance. She is  well-developed. She is not ill-appearing, toxic-appearing or diaphoretic.     Interventions: Face mask in place.  HENT:     Head: Normocephalic and atraumatic.     Right Ear: External ear normal.     Left Ear: External ear normal.  Eyes:     General: Lids are normal. No scleral icterus.       Right eye: No discharge.        Left eye: No discharge.      Conjunctiva/sclera: Conjunctivae normal.  Neck:     Trachea: Phonation normal. No tracheal deviation.  Cardiovascular:     Rate and Rhythm: Normal rate and regular rhythm.     Pulses: Normal pulses.          Radial pulses are 2+ on the right side and 2+ on the left side.       Posterior tibial pulses are 2+ on the right side and 2+ on the left side.     Heart sounds: Normal heart sounds. No murmur heard.  No friction rub. No gallop.   Pulmonary:     Effort: Pulmonary effort is normal. No respiratory distress.     Breath sounds: Normal breath sounds. No stridor. No wheezing, rhonchi or rales.  Chest:     Chest wall: No tenderness.  Abdominal:     General: Abdomen is flat. Bowel sounds are normal. There is no distension.     Palpations: Abdomen is soft. There is no hepatomegaly, splenomegaly, mass or pulsatile mass.     Tenderness: There is no abdominal tenderness. There is no right CVA tenderness, left CVA tenderness, guarding or rebound. Negative signs include Murphy's sign.  Musculoskeletal:     Right lower leg: No edema.     Left lower leg: No edema.  Skin:    General: Skin is warm and dry.     Coloration: Skin is not jaundiced or pale.     Findings: No rash.  Neurological:     Mental Status: She is alert.     Motor: No abnormal muscle tone.     Gait: Gait normal.  Psychiatric:        Mood and Affect: Mood normal.        Speech: Speech normal.        Behavior: Behavior normal.      Results for orders placed or performed in visit on 10/16/19  Iron, TIBC and Ferritin Panel  Result Value Ref Range   Iron 95 40 - 190 mcg/dL   TIBC 418 250 - 450 mcg/dL (calc)   %SAT 23 16 - 45 % (calc)   Ferritin 10 (L) 16 - 154 ng/mL  B12 and Folate Panel  Result Value Ref Range   Vitamin B-12 839 200 - 1,100 pg/mL   Folate 11.8 ng/mL  TSH  Result Value Ref Range   TSH 1.59 mIU/L  T4, Free  Result Value Ref Range   Free T4 1.2 0.8 - 1.4 ng/dL       Assessment & Plan:   1.  Generalized abdominal pain Patient presented with several weeks of abdominal pain that had a acutely worsened with few days of nausea and vomiting, that subsequently resolved.  Her appetite has been gradually improving but she continued to have some abdominal pain and cramping.  She has a past medical history of constipation and suspected irritable bowel  Encouraged her to titrate MiraLAX to help soften stools, push fluids and increase fiber in her diet She notes currently very  poor diet with not much fluid intake and she knows she is sensitive to dairy this causes worsening GI symptoms yet she does not avoid them.  Encouraged to avoid dairy, given handout for other diet lifestyle changes which would help with her constipation also gave her information about IBS  My suspicion is that she has history of chronic constipation and either IBS or food sensitivities and she recently had a acute gastroenteritis that is gradually improving.  She is to work on her constipation symptoms, can use Bentyl as needed.   Encourage patient to follow-up if in the next 2 to 4 weeks her symptoms or not improving we can then refer her to GI.   - COMPLETE METABOLIC PANEL WITH GFR - CBC with Differential/Platelet - Urinalysis, Routine w reflex microscopic - dicyclomine (BENTYL) 20 MG tablet; Take 1 tablet (20 mg total) by mouth 3 (three) times daily as needed for spasms (abd cramping).  Dispense: 20 tablet; Refill: 1 - MICROSCOPIC MESSAGE  2. Need for influenza vaccination  - Flu Vaccine QUAD 6+ mos PF IM (Fluarix Quad PF)      Delsa Grana, PA-C 07/18/20 10:05 AM

## 2020-07-18 ENCOUNTER — Encounter: Payer: Self-pay | Admitting: Family Medicine

## 2020-07-18 ENCOUNTER — Ambulatory Visit (INDEPENDENT_AMBULATORY_CARE_PROVIDER_SITE_OTHER): Payer: BC Managed Care – PPO | Admitting: Family Medicine

## 2020-07-18 ENCOUNTER — Other Ambulatory Visit: Payer: Self-pay

## 2020-07-18 VITALS — BP 118/80 | HR 96 | Temp 98.2°F | Resp 16 | Ht 65.0 in | Wt 115.4 lb

## 2020-07-18 DIAGNOSIS — Z23 Encounter for immunization: Secondary | ICD-10-CM | POA: Diagnosis not present

## 2020-07-18 DIAGNOSIS — R1084 Generalized abdominal pain: Secondary | ICD-10-CM

## 2020-07-18 MED ORDER — DICYCLOMINE HCL 20 MG PO TABS: 20.0000 mg | ORAL_TABLET | Freq: Three times a day (TID) | ORAL | 1 refills | Status: DC | PRN

## 2020-07-18 NOTE — Patient Instructions (Addendum)
Try starting miralax 1/2 cap to 1 cap daily to soften stool Push fluids Add fiber into diet Avoid dairy and cheese See handouts below    Constipation, Adult Constipation is when a person:  Poops (has a bowel movement) fewer times in a week than normal.  Has a hard time pooping.  Has poop that is dry, hard, or bigger than normal. Follow these instructions at home: Eating and drinking   Eat foods that have a lot of fiber, such as: ? Fresh fruits and vegetables. ? Whole grains. ? Beans.  Eat less of foods that are high in fat, low in fiber, or overly processed, such as: ? [place] fries. ? Hamburgers. ? Cookies. ? Candy. ? Soda.  Drink enough fluid to keep your pee (urine) clear or pale yellow. General instructions  Exercise regularly or as told by your doctor.  Go to the restroom when you feel like you need to poop. Do not hold it in.  Take over-the-counter and prescription medicines only as told by your doctor. These include any fiber supplements.  Do pelvic floor retraining exercises, such as: ? Doing deep breathing while relaxing your lower belly (abdomen). ? Relaxing your pelvic floor while pooping.  Watch your condition for any changes.  Keep all follow-up visits as told by your doctor. This is important. Contact a doctor if:  You have pain that gets worse.  You have a fever.  You have not pooped for 4 days.  You throw up (vomit).  You are not hungry.  You lose weight.  You are bleeding from the anus.  You have thin, pencil-like poop (stool). Get help right away if:  You have a fever, and your symptoms suddenly get worse.  You leak poop or have blood in your poop.  Your belly feels hard or bigger than normal (is bloated).  You have very bad belly pain.  You feel dizzy or you faint. This information is not intended to replace advice given to you by your health care provider. Make sure you discuss any questions you have with your health care  provider. Document Revised: 09/16/2017 Document Reviewed: 03/24/2016 Elsevier Patient Education  [employer].    Irritable Bowel Syndrome, Adult  Irritable bowel syndrome (IBS) is a group of symptoms that affects the organs responsible for digestion (gastrointestinal or GI tract). IBS is not one specific disease. To regulate how the GI tract works, the body sends signals back and forth between the intestines and the brain. If you have IBS, there may be a problem with these signals. As a result, the GI tract does not function normally. The intestines may become more sensitive and overreact to certain things. This may be especially true when you eat certain foods or when you are under stress. There are four types of IBS. These may be determined based on the consistency of your stool (feces):  IBS with diarrhea.  IBS with constipation.  Mixed IBS.  Unsubtyped IBS. It is important to know which type of IBS you have. Certain treatments are more likely to be helpful for certain types of IBS. What are the causes? The exact cause of IBS is not known. What increases the risk? You may have a higher risk for IBS if you:  Are female.  Are younger than 54.  Have a family history of IBS.  Have a mental health condition, such as depression, anxiety, or post-traumatic stress disorder.  Have had a bacterial infection of your GI tract. What are  the signs or symptoms? Symptoms of IBS vary from person to person. The main symptom is abdominal pain or discomfort. Other symptoms usually include one or more of the following:  Diarrhea, constipation, or both.  Abdominal swelling or bloating.  Feeling full after eating a small or regular-sized meal.  Frequent gas.  Mucus in the stool.  A feeling of having more stool left after a bowel movement. Symptoms tend to come and go. They may be triggered by stress, mental health conditions, or certain foods. How is this diagnosed? This  condition may be diagnosed based on a physical exam, your medical history, and your symptoms. You may have tests, such as:  Blood tests.  Stool test.  X-rays.  CT scan.  Colonoscopy. This is a procedure in which your GI tract is viewed with a long, thin, flexible tube. How is this treated? There is no cure for IBS, but treatment can help relieve symptoms. Treatment depends on the type of IBS you have, and may include:  Changes to your diet, such as: ? Avoiding foods that cause symptoms. ? Drinking more water. ? Following a low-FODMAP (fermentable oligosaccharides, disaccharides, monosaccharides, and polyols) diet for up to 6 weeks, or as told by your health care provider. FODMAPs are sugars that are hard for some people to digest. ? Eating more fiber. ? Eating medium-sized meals at the same times every day.  Medicines. These may include: ? Fiber supplements, if you have constipation. ? Medicine to control diarrhea (antidiarrheal medicines). ? Medicine to help control muscle tightening (spasms) in your GI tract (antispasmodic medicines). ? Medicines to help with mental health conditions, such as antidepressants or tranquilizers.  Talk therapy or counseling.  Working with a diet and nutrition specialist (dietitian) to help create a food plan that is right for you.  Managing your stress. Follow these instructions at home: Eating and drinking  Eat a healthy diet.  Eat medium-sized meals at about the same time every day. Do not eat large meals.  Gradually eat more fiber-rich foods. These include whole grains, fruits, and vegetables. This may be especially helpful if you have IBS with constipation.  Eat a diet low in FODMAPs.  Drink enough fluid to keep your urine pale yellow.  Keep a journal of foods that seem to trigger symptoms.  Avoid foods and drinks that: ? Contain added sugar. ? Make your symptoms worse. Dairy products, caffeinated drinks, and carbonated drinks can  make symptoms worse for some people. General instructions  Take over-the-counter and prescription medicines and supplements only as told by your health care provider.  Get enough exercise. Do at least 150 minutes of moderate-intensity exercise each week.  Manage your stress. Getting enough sleep and exercise can help you manage stress.  Keep all follow-up visits as told by your health care provider and therapist. This is important. Alcohol Use  Do not drink alcohol if: ? Your health care provider tells you not to drink. ? You are pregnant, may be pregnant, or are planning to become pregnant.  If you drink alcohol, limit how much you have: ? 0-1 drink a day for women. ? 0-2 drinks a day for men.  Be aware of how much alcohol is in your drink. In the [place] one drink equals one typical bottle of beer (12 oz), one-half glass of wine (5 oz), or one shot of hard liquor (1 oz). Contact a health care provider if you have:  Constant pain.  Weight loss.  Difficulty or pain when  swallowing.  Diarrhea that gets worse. Get help right away if you have:  Severe abdominal pain.  Fever.  Diarrhea with symptoms of dehydration, such as dizziness or dry mouth.  Bright red blood in your stool.  Stool that is black and tarry.  Abdominal swelling.  Vomiting that does not stop.  Blood in your vomit. Summary  Irritable bowel syndrome (IBS) is not one specific disease. It is a group of symptoms that affects digestion.  Your intestines may become more sensitive and overreact to certain things. This may be especially true when you eat certain foods or when you are under stress.  There is no cure for IBS, but treatment can help relieve symptoms. This information is not intended to replace advice given to you by your health care provider. Make sure you discuss any questions you have with your health care provider. Document Revised: 09/27/2017 Document Reviewed: 09/27/2017 Elsevier Patient  Education  2020 [employer].

## 2020-07-19 LAB — COMPLETE METABOLIC PANEL WITH GFR
AG Ratio: 1.8 (calc) (ref 1.0–2.5)
ALT: 7 U/L (ref 6–29)
AST: 15 U/L (ref 10–30)
Albumin: 4.2 g/dL (ref 3.6–5.1)
Alkaline phosphatase (APISO): 55 U/L (ref 31–125)
BUN: 9 mg/dL (ref 7–25)
CO2: 26 mmol/L (ref 20–32)
Calcium: 9.2 mg/dL (ref 8.6–10.2)
Chloride: 106 mmol/L (ref 98–110)
Creat: 0.68 mg/dL (ref 0.50–1.10)
GFR, Est African American: 145 mL/min/{1.73_m2} (ref >=60)
GFR, Est Non African American: 125 mL/min/{1.73_m2} (ref >=60)
Globulin: 2.4 g/dL (calc) (ref 1.9–3.7)
Glucose, Bld: 91 mg/dL (ref 65–99)
Potassium: 4 mmol/L (ref 3.5–5.3)
Sodium: 138 mmol/L (ref 135–146)
Total Bilirubin: 0.5 mg/dL (ref 0.2–1.2)
Total Protein: 6.6 g/dL (ref 6.1–8.1)

## 2020-07-19 LAB — CBC WITH DIFFERENTIAL/PLATELET
Absolute Monocytes: 273 cells/uL (ref 200–950)
Basophils Absolute: 21 cells/uL (ref 0–200)
Basophils Relative: 0.7 %
Eosinophils Absolute: 30 cells/uL (ref 15–500)
Eosinophils Relative: 1 %
HCT: 31.6 % — ABNORMAL LOW (ref 35.0–45.0)
Hemoglobin: 9.8 g/dL — ABNORMAL LOW (ref 11.7–15.5)
Lymphs Abs: 1410 cells/uL (ref 850–3900)
MCH: 26.7 pg — ABNORMAL LOW (ref 27.0–33.0)
MCHC: 31 g/dL — ABNORMAL LOW (ref 32.0–36.0)
MCV: 86.1 fL (ref 80.0–100.0)
MPV: 9.5 fL (ref 7.5–12.5)
Monocytes Relative: 9.1 %
Neutro Abs: 1266 cells/uL — ABNORMAL LOW (ref 1500–7800)
Neutrophils Relative %: 42.2 %
Platelets: 329 10*3/uL (ref 140–400)
RBC: 3.67 10*6/uL — ABNORMAL LOW (ref 3.80–5.10)
RDW: 15.1 % — ABNORMAL HIGH (ref 11.0–15.0)
Total Lymphocyte: 47 %
WBC: 3 10*3/uL — ABNORMAL LOW (ref 3.8–10.8)

## 2020-07-19 LAB — URINALYSIS, ROUTINE W REFLEX MICROSCOPIC
Bilirubin Urine: NEGATIVE
Glucose, UA: NEGATIVE
Hgb urine dipstick: NEGATIVE
Hyaline Cast: NONE SEEN /LPF
Leukocytes,Ua: NEGATIVE
Nitrite: NEGATIVE
RBC / HPF: NONE SEEN /HPF (ref 0–2)
Specific Gravity, Urine: 1.026 (ref 1.001–1.03)
WBC, UA: NONE SEEN /HPF (ref 0–5)
pH: 6.5 (ref 5.0–8.0)

## 2020-07-21 ENCOUNTER — Encounter: Payer: BC Managed Care – PPO | Admitting: Certified Nurse Midwife

## 2020-07-24 ENCOUNTER — Other Ambulatory Visit: Payer: Self-pay | Admitting: Family Medicine

## 2020-10-07 ENCOUNTER — Encounter: Payer: Self-pay | Admitting: Family Medicine

## 2020-10-12 ENCOUNTER — Encounter: Payer: Self-pay | Admitting: Family Medicine

## 2020-10-29 ENCOUNTER — Encounter: Payer: Self-pay | Admitting: Family Medicine

## 2020-11-18 ENCOUNTER — Encounter: Payer: BC Managed Care – PPO | Admitting: Family Medicine

## 2020-12-16 ENCOUNTER — Encounter: Payer: BC Managed Care – PPO | Admitting: Family Medicine

## 2020-12-18 ENCOUNTER — Encounter: Payer: Self-pay | Admitting: Family Medicine

## 2021-02-06 ENCOUNTER — Other Ambulatory Visit: Payer: Self-pay

## 2021-02-06 ENCOUNTER — Emergency Department
Admission: EM | Admit: 2021-02-06 | Discharge: 2021-02-06 | Disposition: A | Discharge status: Home / Self Care | Payer: BC Managed Care – PPO | Attending: Emergency Medicine | Admitting: Emergency Medicine

## 2021-02-06 DIAGNOSIS — Y92410 Unspecified street and highway as the place of occurrence of the external cause: Secondary | ICD-10-CM | POA: Insufficient documentation

## 2021-02-06 DIAGNOSIS — S39012A Strain of muscle, fascia and tendon of lower back, initial encounter: Secondary | ICD-10-CM | POA: Insufficient documentation

## 2021-02-06 DIAGNOSIS — S199XXA Unspecified injury of neck, initial encounter: Secondary | ICD-10-CM | POA: Diagnosis present

## 2021-02-06 DIAGNOSIS — S161XXA Strain of muscle, fascia and tendon at neck level, initial encounter: Secondary | ICD-10-CM | POA: Diagnosis not present
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
## 2021-03-04 ENCOUNTER — Ambulatory Visit (INDEPENDENT_AMBULATORY_CARE_PROVIDER_SITE_OTHER): Payer: BC Managed Care – PPO | Admitting: Family Medicine

## 2021-03-04 ENCOUNTER — Encounter: Payer: Self-pay | Admitting: Family Medicine

## 2021-03-04 ENCOUNTER — Other Ambulatory Visit (HOSPITAL_COMMUNITY)
Admission: RE | Admit: 2021-03-04 | Discharge: 2021-03-04 | Disposition: A | Discharge status: Home / Self Care | Payer: BC Managed Care – PPO | Source: Ambulatory Visit | Attending: Family Medicine | Admitting: Family Medicine

## 2021-03-04 ENCOUNTER — Other Ambulatory Visit: Payer: Self-pay

## 2021-03-04 VITALS — BP 116/62 | HR 91 | Temp 98.4°F | Resp 16 | Ht 63.0 in | Wt 125.6 lb

## 2021-03-04 DIAGNOSIS — D509 Iron deficiency anemia, unspecified: Secondary | ICD-10-CM

## 2021-03-04 DIAGNOSIS — Z113 Encounter for screening for infections with a predominantly sexual mode of transmission: Secondary | ICD-10-CM | POA: Insufficient documentation

## 2021-03-04 DIAGNOSIS — Z0000 Encounter for general adult medical examination without abnormal findings: Secondary | ICD-10-CM | POA: Diagnosis not present

## 2021-03-04 DIAGNOSIS — Z114 Encounter for screening for human immunodeficiency virus [HIV]: Secondary | ICD-10-CM

## 2021-03-04 DIAGNOSIS — N920 Excessive and frequent menstruation with regular cycle: Secondary | ICD-10-CM

## 2021-03-04 DIAGNOSIS — Z1159 Encounter for screening for other viral diseases: Secondary | ICD-10-CM

## 2021-03-04 NOTE — Progress Notes (Signed)
Patient: Margaret Dunn, Female    DOB: 09-Nov-2000, 20 y.o.   MRN: [id number] Delsa Grana, PA-C Visit Date: 03/04/2021  Today's Provider: Delsa Grana, PA-C   Chief Complaint  Patient presents with  . Annual Exam    With pap   Subjective:   Annual physical exam:  Margaret Dunn is a 20 y.o. female who presents today for complete physical exam:  Exercise/Activity:  2 d 10 min each Diet/nutrition:  balanced Sleep:  No concerns  STD screen -  2 female sexual partners in the past year, uses condoms, would like STD testing, has paraguard IUD, no vaginal sx, no pain with sex, no past STDs  Pt wished to do routine f/up on chronic conditions today in addition to CPE. Advised pt of separate visit billing/coding  Abdominal pain and cramping, has gotten better, is using bentyl  Heavy menses - monthly, lasts7 d, heavy bleeding at least 3-4/7 d, soaks tampon and sometimes a pad every 2-4 hours  Anemia and iron deficiency Hemoglobin  Date Value Ref Range Status  07/18/2020 9.8 (L) 11.7 - 15.5 g/dL Final  10/15/2019 11.2 (L) 11.7 - 15.5 g/dL Final  10/10/2018 13.4 11.7 - 15.5 g/dL Final  07/22/2017 9.8 (L) 11.5 - 15.3 g/dL Final   She denies any blood in stool, melena, hematochezia  Prior unintentional weight loss she has gained some weight back since her last office visit and her weight has been stable the past several months no further unintentional weight loss no fatigue, sweats, nausea vomiting, shortness of breath, chronic cough, lymphadenopathy, rash   USPSTF grade A and B recommendations - reviewed and addressed today  Depression:  Phq 9 completed today by patient, was reviewed by me with patient in the room PHQ score is negative, pt feels good PHQ 2/9 Scores 03/04/2021 07/18/2020 10/23/2019 10/15/2019  PHQ - 2 Score 0 1 0 0  PHQ- 9 Score 0 - 0 0   Depression screen [facility] 2/9 03/04/2021 07/18/2020 10/23/2019 10/15/2019 12/25/2018  Decreased Interest 0 1 0 0 0  Down, Depressed,  Hopeless 0 0 0 0 0  PHQ - 2 Score 0 1 0 0 0  Altered sleeping 0 - 0 0 3  Tired, decreased energy 0 - 0 0 1  Change in appetite 0 - 0 0 3  Feeling bad or failure about yourself  0 - 0 0 0  Trouble concentrating 0 - 0 0 1  Moving slowly or fidgety/restless 0 - 0 0 0  Suicidal thoughts 0 - 0 0 0  PHQ-9 Score 0 - 0 0 8  Difficult doing work/chores Not difficult at all - Not difficult at all Not difficult at all Not difficult at all   Alcohol screening: [place] Office Visit from 03/04/2021 in [facility]  AUDIT-C Score 0     Immunizations and Health Maintenance: Health Maintenance  Topic Date Due  . HPV VACCINES (1 - 2-dose series) Never done  . Hepatitis C Screening  Never done  . PAP-Cervical Cytology Screening  Never done  . PAP SMEAR-Modifier  Never done  . COVID-19 Vaccine (3 - Pfizer risk 4-dose series) 03/06/2020  . CHLAMYDIA SCREENING  04/12/2020  . INFLUENZA VACCINE  05/18/2021  . TETANUS/TDAP  10/10/2028  . HIV Screening  Completed    Hep C Screening: due  STD testing and prevention (HIV/chl/gon/syphilis):  Would like screening and is due  Intimate partner violence:  denies  Sexual History/Pain during Intercourse: Not currently in a  committed relationship but sexually active, uses condoms, 2 female sexual partners in the past year  Menstrual History/LMP/Abnormal Bleeding: See HPI Patient's last menstrual period was 02/20/2021.  Incontinence Symptoms: no concerns  Breast cancer: n/a for age Last Mammogram: *see HM list above BRCA gene screening: none known   Cervical cancer screening:  Due for PAP at age 51-24 - per pt reviewed ACS guidelines -patient prefers to defer Pap until age 60 per American Cancer Society guidelines but will do STD screening today Pt deniesfamily hx of cancers - breast, ovarian, uterine, colon:     Osteoporosis:   N/a for age  Skin cancer:  Hx of skin CA -  NO Discussed atypical lesions   Colorectal cancer:    Colonoscopy is not due per age, no problems with bowels   Discussed concerning signs and sx of CRC- denies  Lung cancer:   Low Dose CT Chest recommended if Age 80-80 years, 30 pack-year currently smoking OR have quit w/in 15years. Patient does not qualify.    Social History   Tobacco Use  . Smoking status: Never Smoker  . Smokeless tobacco: Never Used  Vaping Use  . Vaping Use: Never used  Substance Use Topics  . Alcohol use: No  . Drug use: No     Flowsheet Row Office Visit from 03/04/2021 in [facility]  AUDIT-C Score 0      Family History  Problem Relation Age of Onset  . Breast cancer Neg Hx   . Ovarian cancer Neg Hx   . Colon cancer Neg Hx      Blood pressure/Hypertension: BP Readings from Last 3 Encounters:  03/04/21 116/62  02/06/21 116/65  07/18/20 118/80    Weight/Obesity: Wt Readings from Last 3 Encounters:  03/04/21 125 lb 9.6 oz (57 kg)  02/06/21 128 lb (58.1 kg)  07/18/20 115 lb 6.4 oz (52.3 kg)   BMI Readings from Last 3 Encounters:  03/04/21 22.25 kg/m  02/06/21 21.30 kg/m  07/18/20 19.20 kg/m     Lipids:  Lab Results  Component Value Date   CHOL 165 10/10/2018   Lab Results  Component Value Date   HDL 48 10/10/2018   Lab Results  Component Value Date   LDLCALC 103 10/10/2018   Lab Results  Component Value Date   TRIG 47 10/10/2018   Lab Results  Component Value Date   CHOLHDL 3.4 10/10/2018   No results found for: LDLDIRECT Based on the results of lipid panel his/her cardiovascular risk factor ( using [facility] )  in the next 10 years is: The ASCVD Risk score Mikey Bussing DC Jr., et al., 2013) failed to calculate for the following reasons:   The 2013 ASCVD risk score is only valid for ages 46 to 101 Glucose:  Glucose, Bld  Date Value Ref Range Status  07/18/2020 91 65 - 99 mg/dL Final    Comment:    .            Fasting reference interval .   10/15/2019 82 65 - 99 mg/dL Final    Comment:    .             Fasting reference interval .   10/10/2018 83 65 - 99 mg/dL Final    Comment:    .            Fasting reference interval .    Social History      She        Social  History   Socioeconomic History  . Marital status: Significant Other    Spouse name: [name]  . Number of children: Not on file  . Years of education: 49  . Highest education level: Some college, no degree  Occupational History  . Not on file  Tobacco Use  . Smoking status: Never Smoker  . Smokeless tobacco: Never Used  Vaping Use  . Vaping Use: Never used  Substance and Sexual Activity  . Alcohol use: No  . Drug use: No  . Sexual activity: Yes    Birth control/protection: I.U.D., Pill    Comment: Paragard  Other Topics Concern  . Not on file  Social History Narrative  . Not on file   Social Determinants of Health   Financial Resource Strain: Low Risk   . Difficulty of Paying Living Expenses: Not hard at all  Food Insecurity: No Food Insecurity  . Worried About Charity fundraiser in the Last Year: Never true  . Ran Out of Food in the Last Year: Never true  Transportation Needs: No Transportation Needs  . Lack of Transportation (Medical): No  . Lack of Transportation (Non-Medical): No  Physical Activity: Insufficiently Active  . Days of Exercise per Week: 2 days  . Minutes of Exercise per Session: 10 min  Stress: No Stress Concern Present  . Feeling of Stress : Only a little  Social Connections: Moderately Integrated  . Frequency of Communication with Friends and Family: More than three times a week  . Frequency of Social Gatherings with Friends and Family: More than three times a week  . Attends Religious Services: 1 to 4 times per year  . Active Member of Clubs or Organizations: Yes  . Attends Archivist Meetings: More than 4 times per year  . Marital Status: Never married    Family History        Family History  Problem Relation Age of Onset  . Breast cancer Neg Hx   .  Ovarian cancer Neg Hx   . Colon cancer Neg Hx     Patient Active Problem List   Diagnosis Date Noted  . IUD (intrauterine device) in place 12/25/2018  . Vitamin D deficiency 10/11/2018  . 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 Hip strain, right, initial encounter 10/21/2016  . Lower back pain 10/21/2016  . Failed hearing screening 07/15/2016    Past Surgical History:  Procedure Laterality Date  . WISDOM TOOTH EXTRACTION       Current Outpatient Medications:  .  dicyclomine (BENTYL) 20 MG tablet, Take 1 tablet (20 mg total) by mouth 3 (three) times daily as needed for spasms (abd cramping)., Disp: 20 tablet, Rfl: 1 .  PARAGARD INTRAUTERINE COPPER IU, by Intrauterine route., Disp: , Rfl:   No Known Allergies  Patient Care Team: Delsa Grana, PA-C as PCP - General (Family Medicine) Gae Dry, MD as Referring Physician (Obstetrics and Gynecology)  Review of Systems  Constitutional: Negative.  Negative for activity change, appetite change, fatigue and unexpected weight change.  HENT: Negative.   Eyes: Negative.   Respiratory: Negative.  Negative for shortness of breath.   Cardiovascular: Negative.  Negative for chest pain, palpitations and leg swelling.  Gastrointestinal: Negative.  Negative for abdominal pain and blood in stool.  Endocrine: Negative.   Genitourinary: Positive for menstrual problem. Negative for difficulty urinating, dyspareunia, dysuria, flank pain, frequency, pelvic pain, vaginal bleeding, vaginal discharge and vaginal pain.  Musculoskeletal: Negative.  Negative for arthralgias, gait problem, joint  swelling and myalgias.  Skin: Negative.  Negative for pallor and rash.  Allergic/Immunologic: Negative.   Neurological: Negative.  Negative for syncope and weakness.  Hematological: Negative.   Psychiatric/Behavioral: Negative.  Negative for dysphoric mood, self-injury and suicidal ideas. The patient is not nervous/anxious.   All other systems reviewed  and are negative.    I personally reviewed active problem list, medication list, allergies, family history, social history, health maintenance, notes from last encounter, lab results, imaging with the patient/caregiver today.        Objective:   Vitals:  Vitals:   03/04/21 1416  BP: 116/62  Pulse: 91  Resp: 16  Temp: 98.4 F (36.9 C)  Weight: 125 lb 9.6 oz (57 kg)  Height: _0  (1.6 m)    Body mass index is 22.25 kg/m.  Physical Exam Vitals and nursing note reviewed.  Constitutional:      General: She is not in acute distress.    Appearance: Normal appearance. She is well-developed and normal weight. She is not ill-appearing, toxic-appearing or diaphoretic.     Interventions: Face mask in place.  HENT:     Head: Normocephalic and atraumatic.     Right Ear: External ear normal.     Left Ear: External ear normal.     Nose: Nose normal. No congestion or rhinorrhea.     Mouth/Throat:     Mouth: Mucous membranes are moist.     Pharynx: Oropharynx is clear. No oropharyngeal exudate or posterior oropharyngeal erythema.  Eyes:     General: Lids are normal. No scleral icterus.       Right eye: No discharge.        Left eye: No discharge.     Conjunctiva/sclera: Conjunctivae normal.     Comments: Mild palpebral pallor  Neck:     Trachea: Phonation normal. No tracheal deviation.  Cardiovascular:     Rate and Rhythm: Normal rate and regular rhythm.     Pulses: Normal pulses.          Radial pulses are 2+ on the right side and 2+ on the left side.       Posterior tibial pulses are 2+ on the right side and 2+ on the left side.     Heart sounds: Normal heart sounds. No murmur heard. No friction rub. No gallop.   Pulmonary:     Effort: Pulmonary effort is normal. No respiratory distress.     Breath sounds: Normal breath sounds. No stridor. No wheezing, rhonchi or rales.  Chest:     Chest wall: No tenderness.  Abdominal:     General: Bowel sounds are normal. There is no  distension.     Palpations: Abdomen is soft.     Tenderness: There is no abdominal tenderness. There is no right CVA tenderness, left CVA tenderness or guarding.  Musculoskeletal:     Cervical back: Normal range of motion.     Right lower leg: No edema.     Left lower leg: No edema.  Lymphadenopathy:     Cervical: No cervical adenopathy.  Skin:    General: Skin is warm and dry.     Capillary Refill: Capillary refill takes less than 2 seconds.     Coloration: Skin is not jaundiced or pale.     Findings: No rash.  Neurological:     Mental Status: She is alert.     Motor: No abnormal muscle tone.     Gait: Gait normal.  Psychiatric:  Mood and Affect: Mood normal.        Speech: Speech normal.        Behavior: Behavior normal.       Fall Risk: Fall Risk  03/04/2021 07/18/2020 10/23/2019 10/15/2019 10/10/2018  Falls in the past year? 0 0 0 0 0  Number falls in past yr: 0 0 0 0 -  Injury with Fall? 0 0 0 0 -  Follow up - Falls evaluation completed - - -    Functional Status Survey: Is the patient deaf or have difficulty hearing?: No Does the patient have difficulty seeing, even when wearing glasses/contacts?: No Does the patient have difficulty concentrating, remembering, or making decisions?: No Does the patient have difficulty walking or climbing stairs?: Yes Does the patient have difficulty dressing or bathing?: No Does the patient have difficulty doing errands alone such as visiting a doctor's office or shopping?: No   Assessment & Plan:    CPE completed today  . USPSTF grade A and B recommendations reviewed with patient; age-appropriate recommendations, preventive care, screening tests, etc discussed and encouraged; healthy living encouraged; see AVS for patient education given to patient  . Discussed importance of 150 minutes of physical activity weekly, AHA exercise recommendations given to pt in AVS/handout  . Discussed importance of healthy diet:  eating lean  meats and proteins, avoiding trans fats and saturated fats, avoid simple sugars and excessive carbs in diet, eat 6 servings of fruit/vegetables daily and drink plenty of water and avoid sweet beverages.    . Recommended pt to do annual eye exam and routine dental exams/cleanings  . Depression, alcohol, fall screening completed as documented above and per flowsheets  . Reviewed Health Maintenance: . Today have personally reviewed health maintenance and gaps and advised that she get HPV vaccine series Health Maintenance  Topic Date Due  . HPV VACCINES (1 - 2-dose series) Never done  . Hepatitis C Screening  Never done  . PAP-Cervical Cytology Screening  Never done  . PAP SMEAR-Modifier  Never done  . COVID-19 Vaccine (3 - Pfizer risk 4-dose series) 03/06/2020  . CHLAMYDIA SCREENING  04/12/2020  . INFLUENZA VACCINE  05/18/2021  . TETANUS/TDAP  10/10/2028  . HIV Screening  Completed    . Immunizations: Immunization History  Administered Date(s) Administered  . Influenza,inj,Quad PF,6+ Mos 07/15/2016, 07/22/2017, 10/10/2018, 10/15/2019, 07/18/2020  . PFIZER(Purple Top)SARS-COV-2 Vaccination 01/08/2020, 02/07/2020  . Tdap 10/10/2018      ICD-10-CM   1. Annual physical exam  Z00.00 RPR    HIV antibody (with reflex)    Hepatitis C Antibody    COMPLETE METABOLIC PANEL WITH GFR    CBC w/Diff/Platelet  2. Screening for STD (sexually transmitted disease)  Z11.3 RPR    HIV antibody (with reflex)    Hepatitis C Antibody    Cervicovaginal ancillary only  3. Encounter for hepatitis C screening test for low risk patient  Z11.59 Hepatitis C Antibody  4. Screening for HIV without presence of risk factors  Z11.4 HIV antibody (with reflex)  5. Iron deficiency anemia, unspecified iron deficiency anemia type  D50.9 CBC w/Diff/Platelet    Iron, TIBC and Ferritin Panel   possibly due to chronic blood loss with menstruation - recheck labs, consider iron supplement, GYN f/up, r/o GI blood loss  6.  Menorrhagia with regular cycle  N92.0        Delsa Grana, PA-C 03/04/21 2:21 PM  [facility]

## 2021-03-04 NOTE — Patient Instructions (Signed)
Preventive Care 21-22 Years Old, Female Preventive care refers to lifestyle choices and visits with your health care provider that can promote health and wellness. This includes:  A yearly physical exam. This is also called an annual wellness visit.  Regular dental and eye exams.  Immunizations.  Screening for certain conditions.  Healthy lifestyle choices, such as: ? Eating a healthy diet. ? Getting regular exercise. ? Not using drugs or products that contain nicotine and tobacco. ? Limiting alcohol use. What can I expect for my preventive care visit? Physical exam Your health care provider may check your:  Height and weight. These may be used to calculate your BMI (body mass index). BMI is a measurement that tells if you are at a healthy weight.  Heart rate and blood pressure.  Body temperature.  Skin for abnormal spots. Counseling Your health care provider may ask you questions about your:  Past medical problems.  Family's medical history.  Alcohol, tobacco, and drug use.  Emotional well-being.  Home life and relationship well-being.  Sexual activity.  Diet, exercise, and sleep habits.  Work and work environment.  Access to firearms.  Method of birth control.  Menstrual cycle.  Pregnancy history. What immunizations do I need? Vaccines are usually given at various ages, according to a schedule. Your health care provider will recommend vaccines for you based on your age, medical history, and lifestyle or other factors, such as travel or where you work.   What tests do I need? Blood tests  Lipid and cholesterol levels. These may be checked every 5 years starting at age 20.  Hepatitis C test.  Hepatitis B test. Screening  Diabetes screening. This is done by checking your blood sugar (glucose) after you have not eaten for a while (fasting).  STD (sexually transmitted disease) testing, if you are at risk.  BRCA-related cancer screening. This may be  done if you have a family history of breast, ovarian, tubal, or peritoneal cancers.  Pelvic exam and Pap test. This may be done every 3 years starting at age 21. Starting at age 30, this may be done every 5 years if you have a Pap test in combination with an HPV test. Talk with your health care provider about your test results, treatment options, and if necessary, the need for more tests.   Follow these instructions at home: Eating and drinking  Eat a healthy diet that includes fresh fruits and vegetables, whole grains, lean protein, and low-fat dairy products.  Take vitamin and mineral supplements as recommended by your health care provider.  Do not drink alcohol if: ? Your health care provider tells you not to drink. ? You are pregnant, may be pregnant, or are planning to become pregnant.  If you drink alcohol: ? Limit how much you have to 0-1 drink a day. ? Be aware of how much alcohol is in your drink. In the [place] one drink equals one 12 oz bottle of beer (355 mL), one 5 oz glass of wine (148 mL), or one 1 oz glass of hard liquor (44 mL).   Lifestyle  Take daily care of your teeth and gums. Brush your teeth every morning and night with fluoride toothpaste. Floss one time each day.  Stay active. Exercise for at least 30 minutes 5 or more days each week.  Do not use any products that contain nicotine or tobacco, such as cigarettes, e-cigarettes, and chewing tobacco. If you need help quitting, ask your health care provider.  Do not   use drugs.  If you are sexually active, practice safe sex. Use a condom or other form of protection to prevent STIs (sexually transmitted infections).  If you do not wish to become pregnant, use a form of birth control. If you plan to become pregnant, see your health care provider for a prepregnancy visit.  Find healthy ways to cope with stress, such as: ? Meditation, yoga, or listening to music. ? Journaling. ? Talking to a trusted  person. ? Spending time with friends and family. Safety  Always wear your seat belt while driving or riding in a vehicle.  Do not drive: ? If you have been drinking alcohol. Do not ride with someone who has been drinking. ? When you are tired or distracted. ? While texting.  Wear a helmet and other protective equipment during sports activities.  If you have firearms in your house, make sure you follow all gun safety procedures.  Seek help if you have been physically or sexually abused. What's next?  Go to your health care provider once a year for an annual wellness visit.  Ask your health care provider how often you should have your eyes and teeth checked.  Stay up to date on all vaccines. This information is not intended to replace advice given to you by your health care provider. Make sure you discuss any questions you have with your health care provider. Document Revised: 06/01/2020 Document Reviewed: 06/15/2018 Elsevier Patient Education  2021 [employer].

## 2021-03-05 LAB — CBC WITH DIFFERENTIAL/PLATELET
Absolute Monocytes: 323 cells/uL (ref 200–950)
Basophils Absolute: 42 cells/uL (ref 0–200)
Basophils Relative: 1.1 %
Eosinophils Absolute: 72 cells/uL (ref 15–500)
Eosinophils Relative: 1.9 %
HCT: 30.4 % — ABNORMAL LOW (ref 35.0–45.0)
Hemoglobin: 9.4 g/dL — ABNORMAL LOW (ref 11.7–15.5)
Lymphs Abs: 1889 cells/uL (ref 850–3900)
MCH: 26.4 pg — ABNORMAL LOW (ref 27.0–33.0)
MCHC: 30.9 g/dL — ABNORMAL LOW (ref 32.0–36.0)
MCV: 85.4 fL (ref 80.0–100.0)
MPV: 9.3 fL (ref 7.5–12.5)
Monocytes Relative: 8.5 %
Neutro Abs: 1474 cells/uL — ABNORMAL LOW (ref 1500–7800)
Neutrophils Relative %: 38.8 %
Platelets: 350 10*3/uL (ref 140–400)
RBC: 3.56 10*6/uL — ABNORMAL LOW (ref 3.80–5.10)
RDW: 15.2 % — ABNORMAL HIGH (ref 11.0–15.0)
Total Lymphocyte: 49.7 %
WBC: 3.8 10*3/uL (ref 3.8–10.8)

## 2021-03-05 LAB — HEPATITIS C ANTIBODY
Hepatitis C Ab: NONREACTIVE
SIGNAL TO CUT-OFF: 0.01 (ref <1.00)

## 2021-03-05 LAB — COMPLETE METABOLIC PANEL WITH GFR
AG Ratio: 1.7 (calc) (ref 1.0–2.5)
ALT: 10 U/L (ref 6–29)
AST: 18 U/L (ref 10–30)
Albumin: 4.3 g/dL (ref 3.6–5.1)
Alkaline phosphatase (APISO): 52 U/L (ref 31–125)
BUN: 17 mg/dL (ref 7–25)
CO2: 27 mmol/L (ref 20–32)
Calcium: 9.9 mg/dL (ref 8.6–10.2)
Chloride: 107 mmol/L (ref 98–110)
Creat: 0.8 mg/dL (ref 0.50–1.10)
GFR, Est African American: 121 mL/min/{1.73_m2} (ref >=60)
GFR, Est Non African American: 105 mL/min/{1.73_m2} (ref >=60)
Globulin: 2.6 g/dL (calc) (ref 1.9–3.7)
Glucose, Bld: 74 mg/dL (ref 65–99)
Potassium: 3.9 mmol/L (ref 3.5–5.3)
Sodium: 142 mmol/L (ref 135–146)
Total Bilirubin: 0.4 mg/dL (ref 0.2–1.2)
Total Protein: 6.9 g/dL (ref 6.1–8.1)

## 2021-03-05 LAB — IRON,TIBC AND FERRITIN PANEL
%SAT: 6 % (calc) — ABNORMAL LOW (ref 16–45)
Ferritin: 5 ng/mL — ABNORMAL LOW (ref 16–154)
Iron: 33 ug/dL — ABNORMAL LOW (ref 40–190)
TIBC: 596 mcg/dL (calc) — ABNORMAL HIGH (ref 250–450)

## 2021-03-05 LAB — HIV ANTIBODY (ROUTINE TESTING W REFLEX): HIV 1&2 Ab, 4th Generation: NONREACTIVE

## 2021-03-05 LAB — RPR: RPR Ser Ql: NONREACTIVE

## 2021-03-06 ENCOUNTER — Other Ambulatory Visit: Payer: Self-pay | Admitting: Emergency Medicine

## 2021-03-06 LAB — CERVICOVAGINAL ANCILLARY ONLY
Bacterial Vaginitis (gardnerella): NEGATIVE
Candida Glabrata: NEGATIVE
Candida Vaginitis: NEGATIVE
Chlamydia: NEGATIVE
Comment: NEGATIVE
Comment: NEGATIVE
Comment: NEGATIVE
Comment: NEGATIVE
Comment: NEGATIVE
Comment: NORMAL
Neisseria Gonorrhea: NEGATIVE
Trichomonas: NEGATIVE

## 2021-03-06 NOTE — Addendum Note (Signed)
Addended by: Delsa Grana on: 03/06/2021 12:51 PM   Modules accepted: Orders

## 2021-03-18 ENCOUNTER — Encounter: Payer: Self-pay | Admitting: Oncology

## 2021-03-18 ENCOUNTER — Inpatient Hospital Stay: Payer: BC Managed Care – PPO | Attending: Oncology | Admitting: Oncology

## 2021-03-18 ENCOUNTER — Inpatient Hospital Stay: Payer: BC Managed Care – PPO

## 2021-03-18 VITALS — BP 118/68 | HR 80 | Temp 99.0°F | Resp 18 | Ht 63.0 in | Wt 127.4 lb

## 2021-03-18 DIAGNOSIS — N920 Excessive and frequent menstruation with regular cycle: Secondary | ICD-10-CM | POA: Diagnosis present

## 2021-03-18 DIAGNOSIS — D500 Iron deficiency anemia secondary to blood loss (chronic): Secondary | ICD-10-CM | POA: Diagnosis present

## 2021-03-18 DIAGNOSIS — Z975 Presence of (intrauterine) contraceptive device: Secondary | ICD-10-CM | POA: Insufficient documentation

## 2021-03-18 HISTORY — DX: Iron deficiency anemia secondary to blood loss (chronic): D50.0

## 2021-03-18 NOTE — Progress Notes (Signed)
Pt here to establish care for IDA.

## 2021-03-18 NOTE — Progress Notes (Signed)
Hematology/Oncology Consult note [facility] [phone] [phone]   Patient Care Team: Delsa Grana, PA-C as PCP - General (Family Medicine) Gae Dry, MD as Referring Physician (Obstetrics and Gynecology)  REFERRING PROVIDER: Delsa Grana, PA-C CHIEF COMPLAINTS/REASON FOR VISIT:  Evaluation of iron deficiency anemia  HISTORY OF PRESENTING ILLNESS:  Margaret Dunn is a  20 y.o.  female with PMH listed below was seen in consultation at the request of Delsa Grana, PA-C   for evaluation of iron deficiency anemia.   Reviewed patient's recent labs  03/04/2021 labs revealed anemia with hemoglobin of 9.4, MCV 85.4, normal white count and platelet count.   Reviewed patient's previous labs ordered by primary care physician's office, anemia is chronic onset , duration is since at least 2018.  Associated signs and symptoms: Patient reports fatigue.  Denies SOB with exertion.  Denies weight loss, easy bruising, hematochezia, hemoptysis, hematuria. Context:  History of iron deficiency: Patient reports longstanding iron deficiency.  She has tried oral iron supplementation previously.  She was not able to continue due to GI side effects. Rectal bleeding: Denies Menstrual bleeding/ Vaginal bleeding : She has heavy menstrual bleeding. Hematemesis or hemoptysis : denies Blood in urine : denies   She reports previous history of easy bleeding after wisdom teeth extraction. Patient has an IUD  Review of Systems  Constitutional: Positive for fatigue. Negative for appetite change, chills and fever.  HENT:   Negative for hearing loss and voice change.   Eyes: Negative for eye problems.  Respiratory: Negative for chest tightness and cough.   Cardiovascular: Negative for chest pain.  Gastrointestinal: Negative for abdominal distention, abdominal pain and blood in stool.  Endocrine: Negative for hot flashes.  Genitourinary: Positive for menstrual  problem. Negative for difficulty urinating and frequency.   Musculoskeletal: Negative for arthralgias.  Skin: Negative for itching and rash.  Neurological: Negative for extremity weakness.  Hematological: Negative for adenopathy.  Psychiatric/Behavioral: Negative for confusion.    MEDICAL HISTORY:  Past Medical History:  Diagnosis Date  . Anxiety   . Iron deficiency anemia 10/22/2016  . Neutropenia ([place]) 10/22/2016  . 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 WISDOM TOOTH EXTRACTION      SOCIAL HISTORY: Social History   Socioeconomic History  . Marital status: Significant Other    Spouse name: [name]  . Number of children: Not on file  . Years of education: 53  . Highest education level: Some college, no degree  Occupational History  . Not on file  Tobacco Use  . Smoking status: Never Smoker  . Smokeless tobacco: Never Used  Vaping Use  . Vaping Use: Never used  Substance and Sexual Activity  . Alcohol use: No  . Drug use: No  . Sexual activity: Yes    Birth control/protection: I.U.D., Pill    Comment: Paragard  Other Topics Concern  . Not on file  Social History Narrative  . Not on file   Social Determinants of Health   Financial Resource Strain: Low Risk   . Difficulty of Paying Living Expenses: Not hard at all  Food Insecurity: No Food Insecurity  . Worried About Charity fundraiser in the Last Year: Never true  . Ran Out of Food in the Last Year: Never true  Transportation Needs: No Transportation Needs  . Lack of Transportation (Medical): No  . Lack of Transportation (Non-Medical): No  Physical Activity: Insufficiently Active  . Days of Exercise per Week: 2 days  . Minutes of Exercise per Session: 10 min  Stress: No Stress Concern Present  . Feeling of Stress : Only a little  Social Connections: Moderately Integrated  . Frequency of Communication with Friends and  Family: More than three times a week  . Frequency of Social Gatherings with Friends and Family: More than three times a week  . Attends Religious Services: 1 to 4 times per year  . Active Member of Clubs or Organizations: Yes  . Attends Archivist Meetings: More than 4 times per year  . Marital Status: Never married  Intimate Partner Violence: Not At Risk  . Fear of Current or Ex-Partner: No  . Emotionally Abused: No  . Physically Abused: No  . Sexually Abused: No    FAMILY HISTORY: Family History  Problem Relation Age of Onset  . Healthy Mother   . Healthy Father   . Healthy Sister   . Healthy Sister   . Healthy Sister   . Breast cancer Neg Hx   . Ovarian cancer Neg Hx   . Colon cancer Neg Hx     ALLERGIES:  has No Known Allergies.  MEDICATIONS:  Current Outpatient Medications  Medication Sig Dispense Refill  . dicyclomine (BENTYL) 20 MG tablet Take 1 tablet (20 mg total) by mouth 3 (three) times daily as needed for spasms (abd cramping). 20 tablet 1  . PARAGARD INTRAUTERINE COPPER IU by Intrauterine route.     No current facility-administered medications for this visit.     PHYSICAL EXAMINATION: ECOG PERFORMANCE STATUS: 0 - Asymptomatic Vitals:   03/18/21 1512  BP: 118/68  Pulse: 80  Resp: 18  Temp: 99 F (37.2 C)   Filed Weights   03/18/21 1512  Weight: 127 lb 6.4 oz (57.8 kg)    Physical Exam Constitutional:      General: She is not in acute distress. HENT:     Head: Normocephalic and atraumatic.  Eyes:     General: No scleral icterus. Cardiovascular:     Rate and Rhythm: Normal rate and regular rhythm.     Heart sounds: Normal heart sounds.  Pulmonary:     Effort: Pulmonary effort is normal. No respiratory distress.     Breath sounds: No wheezing or rales.  Abdominal:     General: Bowel sounds are normal. There is no distension.     Palpations: Abdomen is soft.  Musculoskeletal:        General: No deformity. Normal range of motion.      Cervical back: Normal range of motion and neck supple.  Skin:    General: Skin is warm and dry.     Findings: No erythema or rash.  Neurological:     Mental Status: She is alert and oriented to person, place, and time. Mental status is at baseline.     Cranial Nerves: No cranial nerve deficit.     Coordination: Coordination normal.  Psychiatric:        Mood and Affect: Mood normal.       CMP Latest Ref Rng & Units 03/04/2021  Glucose 65 - 99 mg/dL 74  BUN 7 - 25 mg/dL 17  Creatinine 0.50 - 1.10 mg/dL 0.80  Sodium 135 - 146 mmol/L 142  Potassium 3.5 - 5.3 mmol/L 3.9  Chloride 98 - 110 mmol/L 107  CO2 20 - 32 mmol/L 27  Calcium 8.6 - 10.2 mg/dL 9.9  Total Protein  6.1 - 8.1 g/dL 6.9  Total Bilirubin 0.2 - 1.2 mg/dL 0.4  AST 10 - 30 U/L 18  ALT 6 - 29 U/L 10   CBC Latest Ref Rng & Units 03/04/2021  WBC 3.8 - 10.8 Thousand/uL 3.8  Hemoglobin 11.7 - 15.5 g/dL 9.4(L)  Hematocrit 35.0 - 45.0 % 30.4(L)  Platelets 140 - 400 Thousand/uL 350     LABORATORY DATA:  I have reviewed the data as listed Lab Results  Component Value Date   WBC 3.8 03/04/2021   HGB 9.4 (L) 03/04/2021   HCT 30.4 (L) 03/04/2021   MCV 85.4 03/04/2021   PLT 350 03/04/2021   Recent Labs    07/18/20 1026 03/04/21 1506  NA 138 142  K 4.0 3.9  CL 106 107  CO2 26 27  GLUCOSE 91 74  BUN 9 17  CREATININE 0.68 0.80  CALCIUM 9.2 9.9  GFRNONAA 125 105  GFRAA 145 121  PROT 6.6 6.9  AST 15 18  ALT 7 10  BILITOT 0.5 0.4   Iron/TIBC/Ferritin/ %Sat    Component Value Date/Time   IRON 33 (L) 03/04/2021 1506   TIBC 596 (H) 03/04/2021 1506   FERRITIN 5 (L) 03/04/2021 1506   IRONPCTSAT 6 (L) 03/04/2021 1506     RADIOGRAPHIC STUDIES: I have personally reviewed the radiological images as listed and agreed with the findings in the report. No results found.     ASSESSMENT & PLAN:  1. Iron deficiency anemia due to chronic blood loss   2. IUD (intrauterine device) in place   3. Menorrhagia with  regular cycle     Labs are reviewed and discussed with patient. Consistent with iron deficiency anemia. Patient did not tolerate oral iron supplementation due to GI toxicities.  Discussed about IV Venofer option Plan IV iron with Venofer 200mg  weekly x 4 doses. Allergy reactions/infusion reaction including anaphylactic reaction discussed with patient. Other side effects include but not limited to high blood pressure, skin rash, weight gain, leg swelling, etc. Patient voices understanding and willing to proceed.  Patient has IUD.  Recommend patient to discuss with primary care provider for GYN referral for discussion of heavy menstrual bleeding. No orders of the defined types were placed in this encounter.   All questions were answered. The patient knows to call the clinic with any problems questions or concerns.  Cc Delsa Grana, PA-C  Return of visit: 10 weeks Thank you for this kind referral and the opportunity to participate in the care of this patient. A copy of today's note is routed to referring provider   Earlie Server, MD, PhD Hematology Oncology [place] at [facility] 03/18/2021

## 2021-03-20 ENCOUNTER — Ambulatory Visit: Payer: BC Managed Care – PPO

## 2021-03-20 ENCOUNTER — Encounter: Payer: Self-pay | Admitting: Oncology

## 2021-03-23 ENCOUNTER — Inpatient Hospital Stay: Payer: BC Managed Care – PPO

## 2021-03-24 ENCOUNTER — Inpatient Hospital Stay: Payer: BC Managed Care – PPO

## 2021-03-26 ENCOUNTER — Other Ambulatory Visit: Payer: Self-pay

## 2021-03-26 ENCOUNTER — Inpatient Hospital Stay: Payer: BC Managed Care – PPO

## 2021-03-26 VITALS — BP 113/67 | HR 82 | Temp 97.0°F | Resp 17

## 2021-03-26 DIAGNOSIS — D500 Iron deficiency anemia secondary to blood loss (chronic): Secondary | ICD-10-CM

## 2021-03-26 MED ORDER — SODIUM CHLORIDE 0.9 % IV SOLN
Freq: Once | INTRAVENOUS | Status: AC
  Filled 2021-03-26: qty 250

## 2021-03-26 MED ORDER — SODIUM CHLORIDE 0.9 % IV SOLN: 200.0000 mg | Freq: Once | INTRAVENOUS | Status: DC

## 2021-03-26 MED ORDER — IRON SUCROSE 20 MG/ML IV SOLN
200.0000 mg | Freq: Once | INTRAVENOUS | Status: AC
Start: 2021-03-26 — End: 2021-03-26
  Administered 2021-03-26: 200 mg via INTRAVENOUS
  Filled 2021-03-26: qty 10

## 2021-03-26 NOTE — Patient Instructions (Signed)
[facility]   Discharge Instructions: Thank you for choosing [facility] to provide your oncology and hematology care.  If you have a lab appointment with the [facility], please go directly to the [facility] and check in at the registration area.  Wear comfortable clothing and clothing appropriate for easy access to any Portacath or PICC line.   We strive to give you quality time with your provider. You may need to reschedule your appointment if you arrive late (15 or more minutes).  Arriving late affects you and other patients whose appointments are after yours.  Also, if you miss three or more appointments without notifying the office, you may be dismissed from the clinic at the provider's discretion.      For prescription refill requests, have your pharmacy contact our office and allow 72 hours for refills to be completed.    Today you received the following chemotherapy and/or immunotherapy agents Venofer  Iron Sucrose injection What is this medicine? IRON SUCROSE (AHY ern SOO krohs) is an iron complex. Iron is used to make healthy red blood cells, which carry oxygen and nutrients throughout the body. This medicine is used to treat iron deficiency anemia in people with chronic kidney disease. This medicine may be used for other purposes; ask your health care provider or pharmacist if you have questions. COMMON BRAND NAME(S): Venofer What should I tell my health care provider before I take this medicine? They need to know if you have any of these conditions: anemia not caused by low iron levels heart disease high levels of iron in the blood kidney disease liver disease an unusual or allergic reaction to iron, other medicines, foods, dyes, or preservatives pregnant or trying to get pregnant breast-feeding How should I use this medicine? This medicine is for infusion into a vein. It is given by a health care professional in a  hospital or clinic setting. Talk to your pediatrician regarding the use of this medicine in children. While this drug may be prescribed for children as young as 2 years for selected conditions, precautions do apply. Overdosage: If you think you have taken too much of this medicine contact a poison control center or emergency room at once. NOTE: This medicine is only for you. Do not share this medicine with others. What if I miss a dose? It is important not to miss your dose. Call your doctor or health care professional if you are unable to keep an appointment. What may interact with this medicine? Do not take this medicine with any of the following medications: deferoxamine dimercaprol other iron products This medicine may also interact with the following medications: chloramphenicol deferasirox This list may not describe all possible interactions. Give your health care provider a list of all the medicines, herbs, non-prescription drugs, or dietary supplements you use. Also tell them if you smoke, drink alcohol, or use illegal drugs. Some items may interact with your medicine. What should I watch for while using this medicine? Visit your doctor or healthcare professional regularly. Tell your doctor or healthcare professional if your symptoms do not start to get better or if they get worse. You may need blood work done while you are taking this medicine. You may need to follow a special diet. Talk to your doctor. Foods that contain iron include: whole grains/cereals, dried fruits, beans, or peas, leafy green vegetables, and organ meats (liver, kidney). What side effects may I notice from receiving this medicine? Side effects  that you should report to your doctor or health care professional as soon as possible: allergic reactions like skin rash, itching or hives, swelling of the face, lips, or tongue breathing problems changes in blood pressure cough fast, irregular heartbeat feeling faint or  lightheaded, falls fever or chills flushing, sweating, or hot feelings joint or muscle aches/pains seizures swelling of the ankles or feet unusually weak or tired Side effects that usually do not require medical attention (report to your doctor or health care professional if they continue or are bothersome): diarrhea feeling achy headache irritation at site where injected nausea, vomiting stomach upset tiredness This list may not describe all possible side effects. Call your doctor for medical advice about side effects. You may report side effects to FDA at 1-800-FDA-1088. Where should I keep my medicine? This drug is given in a hospital or clinic and will not be stored at home. NOTE: This sheet is a summary. It may not cover all possible information. If you have questions about this medicine, talk to your doctor, pharmacist, or health care provider.  2021 Elsevier/Gold Standard (2011-07-15 17:14:35)       To help prevent nausea and vomiting after your treatment, we encourage you to take your nausea medication as directed.  BELOW ARE SYMPTOMS THAT SHOULD BE REPORTED IMMEDIATELY: *FEVER GREATER THAN 100.4 F (38 C) OR HIGHER *CHILLS OR SWEATING *NAUSEA AND VOMITING THAT IS NOT CONTROLLED WITH YOUR NAUSEA MEDICATION *UNUSUAL SHORTNESS OF BREATH *UNUSUAL BRUISING OR BLEEDING *URINARY PROBLEMS (pain or burning when urinating, or frequent urination) *BOWEL PROBLEMS (unusual diarrhea, constipation, pain near the anus) TENDERNESS IN MOUTH AND THROAT WITH OR WITHOUT PRESENCE OF ULCERS (sore throat, sores in mouth, or a toothache) UNUSUAL RASH, SWELLING OR PAIN  UNUSUAL VAGINAL DISCHARGE OR ITCHING   Items with * indicate a potential emergency and should be followed up as soon as possible or go to the Emergency Department if any problems should occur.  Please show the CHEMOTHERAPY ALERT CARD or IMMUNOTHERAPY ALERT CARD at check-in to the Emergency Department and triage nurse.  Should  you have questions after your visit or need to cancel or reschedule your appointment, please contact [facility]  [phone] and follow the prompts.  Office hours are 8:00 a.m. to 4:30 p.m. Monday - Friday. Please note that voicemails left after 4:00 p.m. may not be returned until the following business day.  We are closed weekends and major holidays. You have access to a nurse at all times for urgent questions. Please call the main number to the clinic [phone] and follow the prompts.  For any non-urgent questions, you may also contact your provider using MyChart. We now offer e-Visits for anyone 62 and older to request care online for non-urgent symptoms. For details visit [email or web address].   Also download the MyChart app! Go to the app store, search "MyChart", open the app, select [facility], and log in with your MyChart username and password.  Due to Covid, a mask is required upon entering the hospital/clinic. If you do not have a mask, one will be given to you upon arrival. For doctor visits, patients may have 1 support person aged 84 or older with them. For treatment visits, patients cannot have anyone with them due to current Covid guidelines and our immunocompromised population.

## 2021-03-30 ENCOUNTER — Other Ambulatory Visit: Payer: Self-pay

## 2021-03-30 ENCOUNTER — Inpatient Hospital Stay: Payer: BC Managed Care – PPO

## 2021-03-30 VITALS — BP 111/60 | HR 74 | Temp 97.3°F | Resp 17

## 2021-03-30 DIAGNOSIS — D500 Iron deficiency anemia secondary to blood loss (chronic): Secondary | ICD-10-CM | POA: Diagnosis not present

## 2021-03-30 MED ORDER — SODIUM CHLORIDE 0.9 % IV SOLN: 200.0000 mg | Freq: Once | INTRAVENOUS | Status: DC

## 2021-03-30 MED ORDER — SODIUM CHLORIDE 0.9 % IV SOLN
Freq: Once | INTRAVENOUS | Status: AC
  Filled 2021-03-30: qty 250

## 2021-03-30 MED ORDER — IRON SUCROSE 20 MG/ML IV SOLN
200.0000 mg | Freq: Once | INTRAVENOUS | Status: AC
  Administered 2021-03-30: 200 mg via INTRAVENOUS
  Filled 2021-03-30: qty 10

## 2021-03-30 NOTE — Patient Instructions (Signed)
[facility]  Discharge Instructions: Thank you for choosing [facility] to provide your oncology and hematology care.  If you have a lab appointment with the Cancer Center, please go directly to the Cancer Center and check in at the registration area.  Wear comfortable clothing and clothing appropriate for easy access to any Portacath or PICC line.   We strive to give you quality time with your provider. You may need to reschedule your appointment if you arrive late (15 or more minutes).  Arriving late affects you and other patients whose appointments are after yours.  Also, if you miss three or more appointments without notifying the office, you may be dismissed from the clinic at the provider's discretion.      For prescription refill requests, have your pharmacy contact our office and allow 72 hours for refills to be completed.    Today you received venofer   To help prevent nausea and vomiting after your treatment, we encourage you to take your nausea medication as directed.  BELOW ARE SYMPTOMS THAT SHOULD BE REPORTED IMMEDIATELY: *FEVER GREATER THAN 100.4 F (38 C) OR HIGHER *CHILLS OR SWEATING *NAUSEA AND VOMITING THAT IS NOT CONTROLLED WITH YOUR NAUSEA MEDICATION *UNUSUAL SHORTNESS OF BREATH *UNUSUAL BRUISING OR BLEEDING *URINARY PROBLEMS (pain or burning when urinating, or frequent urination) *BOWEL PROBLEMS (unusual diarrhea, constipation, pain near the anus) TENDERNESS IN MOUTH AND THROAT WITH OR WITHOUT PRESENCE OF ULCERS (sore throat, sores in mouth, or a toothache) UNUSUAL RASH, SWELLING OR PAIN  UNUSUAL VAGINAL DISCHARGE OR ITCHING   Items with * indicate a potential emergency and should be followed up as soon as possible or go to the Emergency Department if any problems should occur.  Please show the CHEMOTHERAPY ALERT CARD or IMMUNOTHERAPY ALERT CARD at check-in to the Emergency Department and triage nurse.  Should you  have questions after your visit or need to cancel or reschedule your appointment, please contact [facility]  [phone] and follow the prompts.  Office hours are 8:00 a.m. to 4:30 p.m. Monday - Friday. Please note that voicemails left after 4:00 p.m. may not be returned until the following business day.  We are closed weekends and major holidays. You have access to a nurse at all times for urgent questions. Please call the main number to the clinic [phone] and follow the prompts.  For any non-urgent questions, you may also contact your provider using MyChart. We now offer e-Visits for anyone 18 and older to request care online for non-urgent symptoms. For details visit [email or web address].   Also download the MyChart app! Go to the app store, search "MyChart", open the app, select [facility], and log in with your MyChart username and password.  Due to Covid, a mask is required upon entering the hospital/clinic. If you do not have a mask, one will be given to you upon arrival. For doctor visits, patients may have 1 support person aged 18 or older with them. For treatment visits, patients cannot have anyone with them due to current Covid guidelines and our immunocompromised population.  

## 2021-04-06 ENCOUNTER — Inpatient Hospital Stay: Payer: BC Managed Care – PPO

## 2021-04-06 VITALS — BP 102/66 | HR 81 | Temp 97.2°F | Resp 20

## 2021-04-06 DIAGNOSIS — D500 Iron deficiency anemia secondary to blood loss (chronic): Secondary | ICD-10-CM | POA: Diagnosis not present

## 2021-04-06 MED ORDER — SODIUM CHLORIDE 0.9 % IV SOLN
Freq: Once | INTRAVENOUS | Status: AC
  Filled 2021-04-06: qty 250

## 2021-04-06 MED ORDER — SODIUM CHLORIDE 0.9 % IV SOLN: 200.0000 mg | Freq: Once | INTRAVENOUS | Status: DC

## 2021-04-06 MED ORDER — IRON SUCROSE 20 MG/ML IV SOLN
200.0000 mg | Freq: Once | INTRAVENOUS | Status: AC
  Administered 2021-04-06: 200 mg via INTRAVENOUS
  Filled 2021-04-06: qty 10

## 2021-04-06 NOTE — Patient Instructions (Signed)

## 2021-04-13 ENCOUNTER — Inpatient Hospital Stay: Payer: BC Managed Care – PPO

## 2021-04-15 ENCOUNTER — Other Ambulatory Visit: Payer: Self-pay

## 2021-04-15 ENCOUNTER — Inpatient Hospital Stay: Payer: BC Managed Care – PPO

## 2021-04-15 VITALS — BP 123/88 | HR 72 | Temp 97.7°F | Resp 20

## 2021-04-15 DIAGNOSIS — D500 Iron deficiency anemia secondary to blood loss (chronic): Secondary | ICD-10-CM

## 2021-04-15 MED ORDER — IRON SUCROSE 20 MG/ML IV SOLN
200.0000 mg | Freq: Once | INTRAVENOUS | Status: AC
  Administered 2021-04-15: 200 mg via INTRAVENOUS
  Filled 2021-04-15: qty 10

## 2021-04-15 MED ORDER — SODIUM CHLORIDE 0.9 % IV SOLN
Freq: Once | INTRAVENOUS | Status: AC
Start: 2021-04-15 — End: 2021-04-15
  Filled 2021-04-15: qty 250

## 2021-04-15 MED ORDER — SODIUM CHLORIDE 0.9 % IV SOLN: 200.0000 mg | Freq: Once | INTRAVENOUS | Status: DC

## 2021-04-15 NOTE — Patient Instructions (Signed)

## 2021-05-25 ENCOUNTER — Inpatient Hospital Stay: Payer: BC Managed Care – PPO | Attending: Oncology

## 2021-05-25 DIAGNOSIS — D500 Iron deficiency anemia secondary to blood loss (chronic): Secondary | ICD-10-CM | POA: Insufficient documentation

## 2021-05-25 DIAGNOSIS — N920 Excessive and frequent menstruation with regular cycle: Secondary | ICD-10-CM

## 2021-05-25 DIAGNOSIS — Z975 Presence of (intrauterine) contraceptive device: Secondary | ICD-10-CM

## 2021-05-25 LAB — CBC WITH DIFFERENTIAL/PLATELET
Abs Immature Granulocytes: 0.01 10*3/uL (ref 0.00–0.07)
Basophils Absolute: 0 10*3/uL (ref 0.0–0.1)
Basophils Relative: 1 %
Eosinophils Absolute: 0.1 10*3/uL (ref 0.0–0.5)
Eosinophils Relative: 1 %
HCT: 38.1 % (ref 36.0–46.0)
Hemoglobin: 12.5 g/dL (ref 12.0–15.0)
Immature Granulocytes: 0 %
Lymphocytes Relative: 49 %
Lymphs Abs: 1.9 10*3/uL (ref 0.7–4.0)
MCH: 30.1 pg (ref 26.0–34.0)
MCHC: 32.8 g/dL (ref 30.0–36.0)
MCV: 91.8 fL (ref 80.0–100.0)
Monocytes Absolute: 0.4 10*3/uL (ref 0.1–1.0)
Monocytes Relative: 9 %
Neutro Abs: 1.6 10*3/uL — ABNORMAL LOW (ref 1.7–7.7)
Neutrophils Relative %: 40 %
Platelets: 266 10*3/uL (ref 150–400)
RBC: 4.15 MIL/uL (ref 3.87–5.11)
RDW: 15.7 % — ABNORMAL HIGH (ref 11.5–15.5)
WBC: 4 10*3/uL (ref 4.0–10.5)
nRBC: 0 % (ref 0.0–0.2)

## 2021-05-25 LAB — TECHNOLOGIST SMEAR REVIEW: Plt Morphology: ADEQUATE

## 2021-05-25 LAB — RETIC PANEL
Immature Retic Fract: 7.6 % (ref 2.3–15.9)
RBC.: 4.17 MIL/uL (ref 3.87–5.11)
Retic Count, Absolute: 72.6 10*3/uL (ref 19.0–186.0)
Retic Ct Pct: 1.7 % (ref 0.4–3.1)
Reticulocyte Hemoglobin: 35.8 pg (ref >27.9)

## 2021-05-25 LAB — IRON AND TIBC
Iron: 161 ug/dL (ref 28–170)
Saturation Ratios: 35 % — ABNORMAL HIGH (ref 10.4–31.8)
TIBC: 463 ug/dL — ABNORMAL HIGH (ref 250–450)
UIBC: 302 ug/dL

## 2021-05-25 LAB — FERRITIN: Ferritin: 22 ng/mL (ref 11–307)

## 2021-05-27 ENCOUNTER — Inpatient Hospital Stay (HOSPITAL_BASED_OUTPATIENT_CLINIC_OR_DEPARTMENT_OTHER): Payer: BC Managed Care – PPO | Admitting: Oncology

## 2021-05-27 ENCOUNTER — Other Ambulatory Visit: Payer: Self-pay

## 2021-05-27 ENCOUNTER — Encounter: Payer: Self-pay | Admitting: Oncology

## 2021-05-27 DIAGNOSIS — D500 Iron deficiency anemia secondary to blood loss (chronic): Secondary | ICD-10-CM

## 2021-05-27 LAB — VON WILLEBRAND PANEL
Coagulation Factor VIII: 69 % (ref 56–140)
Ristocetin Co-factor, Plasma: 59 % (ref 50–200)
Von Willebrand Antigen, Plasma: 60 % (ref 50–200)

## 2021-05-27 LAB — COAG STUDIES INTERP REPORT

## 2021-05-27 NOTE — Progress Notes (Signed)
Contacted pt for Mychart. No new concerns voiced.

## 2021-05-27 NOTE — Progress Notes (Signed)
HEMATOLOGY-ONCOLOGY TeleHEALTH VISIT PROGRESS NOTE  I connected with [name] on 05/27/21  at  3:45 PM EDT by video enabled telemedicine visit and verified that I am speaking with the correct person using two identifiers. I discussed the limitations, risks, security and privacy concerns of performing an evaluation and management service by telemedicine and the availability of in-person appointments. The patient expressed understanding and agreed to proceed.   Other persons participating in the visit and their role in the encounter:  None  Patient's location: in her vehicle. Not driving.  Provider's location: office Chief Complaint: iron deficiency anemia.    INTERVAL HISTORY Margaret Dunn is a 20 y.o. female who has above history reviewed by me today presents for follow up visit for iron deficiency anemia.  S/p IV venofer treatments. She feels better, fatigue has improved.   Review of Systems  Constitutional:  Negative for appetite change, chills, fatigue and fever.  HENT:   Negative for hearing loss and voice change.   Eyes:  Negative for eye problems.  Respiratory:  Negative for chest tightness and cough.   Cardiovascular:  Negative for chest pain.  Gastrointestinal:  Negative for abdominal distention, abdominal pain and blood in stool.  Endocrine: Negative for hot flashes.  Genitourinary:  Negative for difficulty urinating and frequency.   Musculoskeletal:  Negative for arthralgias.  Skin:  Negative for itching and rash.  Neurological:  Negative for extremity weakness.  Hematological:  Negative for adenopathy.  Psychiatric/Behavioral:  Negative for confusion.    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   Past Surgical History:  Procedure Laterality Date   WISDOM TOOTH  EXTRACTION      Family History  Problem Relation Age of Onset   Healthy Mother    Healthy Father    Healthy Sister    Healthy Sister    Healthy Sister    Breast cancer Neg Hx    Ovarian cancer Neg Hx    Colon cancer Neg Hx     Social History   Socioeconomic History   Marital status: Significant Other    Spouse name: [name]   Number of children: Not on file   Years of education: 14   Highest education level: Some college, no degree  Occupational History   Not on file  Tobacco Use   Smoking status: Never   Smokeless tobacco: Never  Vaping Use   Vaping Use: Never used  Substance and Sexual Activity   Alcohol use: No   Drug use: No   Sexual activity: Yes    Birth control/protection: I.U.D., Pill    Comment: Paragard  Other Topics Concern   Not on file  Social History Narrative   Not on file   Social Determinants of Health   Financial Resource Strain: Low Risk    Difficulty of Paying Living Expenses: Not hard at all  Food Insecurity: No Food Insecurity   Worried About Charity fundraiser in the Last Year: Never true   Ran Out of Food in the Last Year: Never true  Transportation Needs: No Transportation Needs   Lack of Transportation (Medical): No   Lack of Transportation (Non-Medical): No  Physical Activity: Insufficiently Active   Days of Exercise per Week: 2 days   Minutes of Exercise per Session: 10 min  Stress: No Stress Concern Present   Feeling of Stress : Only a little  Social Connections: Moderately Integrated   Frequency of Communication with Friends and Family: More than three times a week   Frequency of Social Gatherings with Friends and Family: More than three times a week   Attends Religious Services: 1 to 4 times per year   Active Member of [employer] or Organizations: Yes   Attends Music therapist: More than 4 times per year   Marital Status: Never married  Human resources officer Violence: Not At Risk   Fear of Current or Ex-Partner: No    Emotionally Abused: No   Physically Abused: No   Sexually Abused: No    Current Outpatient Medications on File Prior to Visit  Medication Sig Dispense Refill   dicyclomine (BENTYL) 20 MG tablet Take 1 tablet (20 mg total) by mouth 3 (three) times daily as needed for spasms (abd cramping). 20 tablet 1   PARAGARD INTRAUTERINE COPPER IU by Intrauterine route.     No current facility-administered medications on file prior to visit.    No Known Allergies     Observations/Objective: Today's Vitals   05/27/21 1511  PainSc: 0-No pain   There is no height or weight on file to calculate BMI.  Physical Exam Neurological:     Mental Status: She is alert.    CBC    Component Value Date/Time   WBC 4.0 05/25/2021 1349   RBC 4.17 05/25/2021 1349   RBC 4.15 05/25/2021 1349   HGB 12.5 05/25/2021 1349   HCT 38.1 05/25/2021 1349   PLT 266 05/25/2021 1349   MCV 91.8 05/25/2021 1349   MCH 30.1 05/25/2021 1349   MCHC 32.8 05/25/2021 1349   RDW 15.7 (H) 05/25/2021 1349   LYMPHSABS 1.9 05/25/2021 1349   MONOABS 0.4 05/25/2021 1349   EOSABS 0.1 05/25/2021 1349   BASOSABS 0.0 05/25/2021 1349    CMP     Component Value Date/Time   NA 142 03/04/2021 1506   K 3.9 03/04/2021 1506   CL 107 03/04/2021 1506   CO2 27 03/04/2021 1506   GLUCOSE 74 03/04/2021 1506   BUN 17 03/04/2021 1506   CREATININE 0.80 03/04/2021 1506   CALCIUM 9.9 03/04/2021 1506   PROT 6.9 03/04/2021 1506   AST 18 03/04/2021 1506   ALT 10 03/04/2021 1506   BILITOT 0.4 03/04/2021 1506   GFRNONAA 105 03/04/2021 1506   GFRAA 121 03/04/2021 1506     Assessment and Plan: 1. Iron deficiency anemia due to chronic blood loss     Labs are reviewed and discussed with patient. Both hemoglobin and iron panel have improved.  Hold additional IV venofer.    Follow Up Instructions: 6 months.    I discussed the assessment and treatment plan with the patient. The patient was provided an opportunity to ask questions and all  were answered. The patient agreed with the plan and demonstrated an understanding of the instructions.  The patient was advised to call back or seek an in-person evaluation if the symptoms worsen or if the condition fails to improve as anticipated.   Earlie Server, MD 05/27/2021 3:35 PM

## 2021-05-28 ENCOUNTER — Inpatient Hospital Stay: Payer: BC Managed Care – PPO

## 2021-07-16 ENCOUNTER — Encounter: Payer: Self-pay | Admitting: Oncology

## 2021-07-17 ENCOUNTER — Ambulatory Visit: Payer: BC Managed Care – PPO

## 2021-07-20 ENCOUNTER — Ambulatory Visit: Payer: Self-pay | Admitting: Physician Assistant

## 2021-07-20 ENCOUNTER — Other Ambulatory Visit: Payer: Self-pay

## 2021-07-20 DIAGNOSIS — Z113 Encounter for screening for infections with a predominantly sexual mode of transmission: Secondary | ICD-10-CM

## 2021-07-20 LAB — WET PREP FOR TRICH, YEAST, CLUE
Trichomonas Exam: NEGATIVE
Yeast Exam: NEGATIVE

## 2021-07-20 NOTE — Progress Notes (Signed)
Patient here for STD testing. Condoms declined. No tx of wet mount per standing orders.

## 2021-07-21 ENCOUNTER — Encounter: Payer: Self-pay | Admitting: Physician Assistant

## 2021-07-21 NOTE — Progress Notes (Signed)
  [facility] Department STI clinic/screening visit  Subjective:  Margaret Dunn is a 20 y.o. female being seen today for an STI screening visit. The patient reports they do not have symptoms.  Patient reports that they do not desire a pregnancy in the next year.   They reported they are not interested in discussing contraception today.  Patient's last menstrual period was 07/03/2021.   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  Denies chronic conditions, surgeries and regular medicines.  LMP was 07/03/2021 and using IUD as BCM.  States last HIV test was May of this year and last pap was in 2020.   See flowsheet for further details and programmatic requirements.    The following portions of the patient's history were reviewed and updated as appropriate: allergies, current medications, past medical history, past social history, past surgical history and problem list.  Objective:  There were no vitals filed for this visit.  Physical Exam Constitutional:      General: She is not in acute distress.    Appearance: Normal appearance.  HENT:     Head: Normocephalic and atraumatic.  Eyes:     Conjunctiva/sclera: Conjunctivae normal.  Pulmonary:     Effort: Pulmonary effort is normal.  Skin:    General: Skin is warm and dry.  Neurological:     Mental Status: She is alert and oriented to person, place, and time.   Psychiatric:        Mood and Affect: Mood normal.        Behavior: Behavior normal.        Thought Content: Thought content normal.        Judgment: Judgment normal.     Assessment and Plan:  Margaret Dunn is a 20 y.o. female presenting to the [facility] Department for STI screening  1. Screening for STD (sexually transmitted disease) Patient into clinic without symptoms. Patient declines pelvic by provider and desires to self collect vaginal samples.  Counseled how to collect for accurate results. Rec condoms with all sex. Await test results.  Counseled that RN will call if needs to RTC for treatment once results are back.  - WET PREP FOR [place], YEAST, [place] LAB - Syphilis Serology, [place] [facility]     No follow-ups on file.  Future Appointments  Date Time Provider [place]  11/27/2021  1:30 PM [facility] None  11/30/2021  2:45 PM Earlie Server, MD [facility] None  03/08/2022  3:00 PM Delsa Grana, PA-C [place], [place]

## 2021-08-12 ENCOUNTER — Telehealth: Payer: BC Managed Care – PPO | Admitting: Physician Assistant

## 2021-08-12 DIAGNOSIS — J019 Acute sinusitis, unspecified: Secondary | ICD-10-CM

## 2021-08-12 DIAGNOSIS — B9789 Other viral agents as the cause of diseases classified elsewhere: Secondary | ICD-10-CM

## 2021-08-12 MED ORDER — FLUTICASONE PROPIONATE 50 MCG/ACT NA SUSP
2.0000 | Freq: Every day | NASAL | 0 refills | Status: DC
Start: 2021-08-12 — End: 2022-05-29

## 2021-08-12 MED ORDER — AMOXICILLIN-POT CLAVULANATE 875-125 MG PO TABS: 1.0000 | ORAL_TABLET | Freq: Two times a day (BID) | ORAL | 0 refills | Status: DC

## 2021-08-12 NOTE — Progress Notes (Signed)
I have spent 5 minutes in review of e-visit questionnaire, review and updating patient chart, medical decision making and response to patient.   Tausha Milhoan Cody Alfhild Partch, PA-C    

## 2021-08-12 NOTE — Progress Notes (Signed)

## 2021-08-12 NOTE — Addendum Note (Signed)
Addended by: Brunetta Jeans on: 08/12/2021 09:42 AM   Modules accepted: Orders

## 2021-08-30 ENCOUNTER — Telehealth: Payer: BC Managed Care – PPO | Admitting: Physician Assistant

## 2021-08-30 DIAGNOSIS — J111 Influenza due to unidentified influenza virus with other respiratory manifestations: Secondary | ICD-10-CM | POA: Diagnosis not present

## 2021-08-30 MED ORDER — OSELTAMIVIR PHOSPHATE 75 MG PO CAPS: 75.0000 mg | ORAL_CAPSULE | Freq: Two times a day (BID) | ORAL | 0 refills | Status: DC

## 2021-08-30 NOTE — Progress Notes (Signed)

## 2021-09-03 ENCOUNTER — Ambulatory Visit
Admission: RE | Admit: 2021-09-03 | Discharge: 2021-09-03 | Disposition: A | Discharge status: Home / Self Care | Payer: BC Managed Care – PPO | Attending: Pediatrics | Admitting: Pediatrics

## 2021-09-03 ENCOUNTER — Telehealth: Payer: BC Managed Care – PPO | Admitting: Nurse Practitioner

## 2021-09-03 ENCOUNTER — Encounter: Payer: Self-pay | Admitting: Nurse Practitioner

## 2021-09-03 ENCOUNTER — Ambulatory Visit
Admission: RE | Admit: 2021-09-03 | Discharge: 2021-09-03 | Disposition: A | Discharge status: Home / Self Care | Payer: BC Managed Care – PPO | Source: Ambulatory Visit | Attending: Nurse Practitioner | Admitting: Nurse Practitioner

## 2021-09-03 VITALS — HR 76 | Temp 97.9°F | Resp 18

## 2021-09-03 DIAGNOSIS — R5081 Fever presenting with conditions classified elsewhere: Secondary | ICD-10-CM | POA: Diagnosis not present

## 2021-09-03 DIAGNOSIS — R051 Acute cough: Secondary | ICD-10-CM

## 2021-09-03 DIAGNOSIS — J111 Influenza due to unidentified influenza virus with other respiratory manifestations: Secondary | ICD-10-CM

## 2021-09-03 MED ORDER — BENZONATATE 100 MG PO CAPS: 200.0000 mg | ORAL_CAPSULE | Freq: Two times a day (BID) | ORAL | 0 refills | Status: DC | PRN

## 2021-09-03 NOTE — Addendum Note (Signed)
Addended by: Serafina Royals F on: 09/03/2021 12:07 PM   Modules accepted: Orders

## 2021-09-03 NOTE — Progress Notes (Signed)
   Pulse 76   Temp 97.9 F (36.6 C)   Resp 18   LMP 08/08/2021   SpO2 98%    Subjective:    Patient ID: Margaret Dunn, female    DOB: 20-Mar-2001, 20 y.o.   MRN: [id number]  HPI: Margaret Dunn is a 20 y.o. female  Chief Complaint  Patient presents with   Influenza    Tested positive Sunday at urgent care given tamiflu   Cough/Fever:  She says that she started getting sick last week.  She says she was seen at urgent care and was tested for the flu.  She says she was positive for Flu A.  She was given Tamiflu and is almost done with the prescription.  She says she has been taking mucinex and alka seltzer cold and flu. She says that she is still running fever and her cough has gotten worse.  She says she feels like she can't take a deep breath and feels shortn of breath at times.  Will get chest xray.  Discussed OTC treatments to use to help with symptoms. She was recently on amoxicillin for a sinus infection.   Relevant past medical, surgical, family and social history reviewed and updated as indicated. Interim medical history since our last visit reviewed. Allergies and medications reviewed and updated.  Review of Systems  Constitutional: Positive for fever, negative for weight change.  Respiratory: Positive for cough and shortness of breath.   Cardiovascular: Negative for chest pain or palpitations.  Gastrointestinal: Negative for abdominal pain, no bowel changes.  Musculoskeletal: Negative for gait problem or joint swelling.  Skin: Negative for rash.  Neurological: Negative for dizziness or headache.  No other specific complaints in a complete review of systems (except as listed in HPI above).      Objective:    Pulse 76   Temp 97.9 F (36.6 C)   Resp 18   LMP 08/08/2021   SpO2 98%   Wt Readings from Last 3 Encounters:  03/18/21 127 lb 6.4 oz (57.8 kg)  03/04/21 125 lb 9.6 oz (57 kg)  02/06/21 128 lb (58.1 kg)    Physical Exam  Constitutional: Patient appears  well-developed and well-nourished. No distress.  HEENT: head atraumatic, normocephalic, pupils equal and reactive to light, neck supple,  Cardiovascular: Normal rate, regular rhythm and normal heart sounds.  No murmur heard. No BLE edema. Pulmonary/Chest: Effort normal and breath sounds rhonchi noted at lung bases. No respiratory distress. Abdominal: Soft.  There is no tenderness. Psychiatric: Patient has a normal mood and affect. behavior is normal. Judgment and thought content normal.   Results for orders placed or performed in visit on 07/20/21  WET PREP FOR [place], YEAST, CLUE  Result Value Ref Range   Trichomonas Exam Negative Negative   Yeast Exam Negative Negative   Clue Cell Exam Comment: Negative      Assessment & Plan:  1. Flu -continue taking Tamiflu -OTC treatments for symptoms -push fluids  2. Acute cough -OTC treatments for symptoms -push fluids - DG Chest 2 View; Future  3. Fever in other diseases -OTC treatments for symptoms Push fluids - DG Chest 2 View; Future    Follow up plan: Return if symptoms worsen or fail to improve.

## 2021-11-19 ENCOUNTER — Other Ambulatory Visit: Payer: Self-pay | Admitting: *Deleted

## 2021-11-19 DIAGNOSIS — D500 Iron deficiency anemia secondary to blood loss (chronic): Secondary | ICD-10-CM

## 2021-11-26 ENCOUNTER — Ambulatory Visit: Payer: Self-pay | Admitting: Family Medicine

## 2021-11-26 ENCOUNTER — Other Ambulatory Visit: Payer: Self-pay

## 2021-11-26 ENCOUNTER — Encounter: Payer: Self-pay | Admitting: Family Medicine

## 2021-11-26 DIAGNOSIS — Z113 Encounter for screening for infections with a predominantly sexual mode of transmission: Secondary | ICD-10-CM

## 2021-11-26 LAB — WET PREP FOR TRICH, YEAST, CLUE
Trichomonas Exam: NEGATIVE
Yeast Exam: NEGATIVE

## 2021-11-26 NOTE — Progress Notes (Signed)
Pt here for STD screening.  Wet mount results reviewed, no treatment required per SO.  Windle Guard, RN

## 2021-11-26 NOTE — Progress Notes (Signed)
[facility] Department  STI clinic/screening visit [place] [address] [phone]  Subjective:  Margaret Dunn is a 21 y.o. female being seen today for an STI screening visit. The patient reports they do not have symptoms.  Patient reports that they do not desire a pregnancy in the next year.   They reported they are not interested in discussing contraception today.    Patient's last menstrual period was 11/22/2021 (approximate).   Patient has the following medical conditions:   Patient Active Problem List   Diagnosis Date Noted   Iron deficiency anemia due to chronic blood loss 03/18/2021   IUD (intrauterine device) in place 12/25/2018   Vitamin D deficiency 10/11/2018   Iron deficiency anemia 10/22/2016   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[place]) 10/21/2016   Hip strain, right, initial encounter 10/21/2016   Lower back pain 10/21/2016   Failed hearing screening 07/15/2016    Chief Complaint  Patient presents with   SEXUALLY TRANSMITTED DISEASE    screening    HPI  Patient reports here for screening, denies s/sx   Last HIV test per patient/review of record was 07/20/2021 No previous pap.   Screening for MPX risk: Does the patient have an unexplained rash? No Is the patient MSM? No Does the patient endorse multiple sex partners or anonymous sex partners? No Did the patient have close or sexual contact with a person diagnosed with MPX? No Has the patient traveled outside the [place] where MPX is endemic? No Is there a high clinical suspicion for MPX-- evidenced by one of the following No  -Unlikely to be chickenpox  -Lymphadenopathy  -Rash that present in same phase of evolution on any given body part See flowsheet for further details and programmatic requirements.    The following portions of the patient's history were reviewed and updated as appropriate: allergies, current medications, past medical history, past social history, past surgical  history and problem list.  Objective:  There were no vitals filed for this visit.  Physical Exam Vitals and nursing note reviewed.  Constitutional:      Appearance: Normal appearance.  HENT:     Head: Normocephalic and atraumatic.     Mouth/Throat:     Mouth: Mucous membranes are moist.     Pharynx: Oropharynx is clear. No oropharyngeal exudate or posterior oropharyngeal erythema.  Pulmonary:     Effort: Pulmonary effort is normal.  Abdominal:     General: Abdomen is flat.     Palpations: There is no mass.     Tenderness: There is no abdominal tenderness. There is no rebound.  Genitourinary:    Exam position: Lithotomy position.     Pubic Area: No rash or pubic lice.      Labia:        Right: No rash or lesion.        Left: No rash or lesion.      Vagina: Normal. No vaginal discharge, erythema, bleeding or lesions.     Cervix: No cervical motion tenderness, discharge, friability, lesion or erythema.     Uterus: Normal.      Adnexa: Right adnexa normal and left adnexa normal.     Comments: Deferred - pt self collected  Musculoskeletal:     Cervical back: Normal range of motion and neck supple.  Lymphadenopathy:     Head:     Right side of head: No preauricular or posterior auricular adenopathy.     Left side of head: No preauricular or posterior  auricular adenopathy.     Cervical: No cervical adenopathy.     Upper Body:     Right upper body: No supraclavicular or axillary adenopathy.     Left upper body: No supraclavicular or axillary adenopathy.     Lower Body: No right inguinal adenopathy. No left inguinal adenopathy.  Skin:    General: Skin is warm and dry.     Findings: No rash.  Neurological:     Mental Status: She is alert and oriented to person, place, and time.  Psychiatric:        Mood and Affect: Mood normal.        Behavior: Behavior normal.     Assessment and Plan:  Margaret Dunn is a 21 y.o. female presenting to the [facility]  Department for STI screening  1. Screening examination for venereal disease Patient accepted all screenings including wet prep, oral, vaginal CT/GC and bloodwork for HIV/RPR.  Patient meets criteria for HepB screening? No. Ordered? No - does not meet criteria  Patient meets criteria for HepC screening? No. Ordered? No - does not meet criteria   Wet prep results- neg    No Treatment needed  Discussed time line for State Lab results and that patient will be called with positive results and encouraged patient to call if she had not heard in 2 weeks.  Counseled to return or seek care for continued or worsening symptoms Recommended condom use with all sex  Patient is currently using  ParaGard   to prevent pregnancy.    - Chlamydia/Gonorrhea [place] [facility] - HIV [place] [facility] - WET PREP FOR [place], YEAST, CLUE - Syphilis Serology, [place] [facility] - [place] [facility]  Anticipated guidance - Discussed importance of PAP smear    No follow-ups on file.  Future Appointments  Date Time Provider [place]  12/01/2021  3:30 PM [facility] None  12/08/2021  3:30 PM Earlie Server, MD [facility] None  03/08/2022  3:00 PM Delsa Grana, PA-C [place]    Junious Dresser, FNP

## 2021-11-27 ENCOUNTER — Inpatient Hospital Stay: Payer: BC Managed Care – PPO

## 2021-11-30 ENCOUNTER — Inpatient Hospital Stay: Payer: BC Managed Care – PPO | Admitting: Oncology

## 2021-12-01 ENCOUNTER — Inpatient Hospital Stay: Payer: BC Managed Care – PPO

## 2021-12-01 ENCOUNTER — Telehealth: Payer: Self-pay | Admitting: *Deleted

## 2021-12-01 NOTE — Telephone Encounter (Signed)
Appts for lab and follow up with Dr. Tasia Catchings are cancelled. Pt stated she would call back to reschedule

## 2021-12-01 NOTE — Telephone Encounter (Signed)
Patient needs to reschedule today's appointment.

## 2021-12-02 ENCOUNTER — Encounter: Payer: Self-pay | Admitting: Oncology

## 2021-12-08 ENCOUNTER — Telehealth: Payer: BC Managed Care – PPO | Admitting: Oncology

## 2021-12-28 ENCOUNTER — Other Ambulatory Visit: Payer: Self-pay

## 2021-12-28 ENCOUNTER — Ambulatory Visit (LOCAL_COMMUNITY_HEALTH_CENTER): Payer: Self-pay

## 2021-12-28 DIAGNOSIS — Z3202 Encounter for pregnancy test, result negative: Secondary | ICD-10-CM

## 2021-12-28 NOTE — Progress Notes (Signed)
Pt was seen in lab for UPT, which was negative today. RN called pt to give results and pt was no where to be found. Pt not in waiting rooms or bathroom. Appears pt left without being seen by nurse. Josie Saunders, RN ? ?

## 2021-12-29 LAB — PREGNANCY, URINE: Preg Test, Ur: NEGATIVE

## 2022-03-08 ENCOUNTER — Ambulatory Visit: Payer: BC Managed Care – PPO | Admitting: Family Medicine

## 2022-05-25 ENCOUNTER — Telehealth: Payer: BC Managed Care – PPO | Admitting: Physician Assistant

## 2022-05-25 DIAGNOSIS — J019 Acute sinusitis, unspecified: Secondary | ICD-10-CM | POA: Diagnosis not present

## 2022-05-25 DIAGNOSIS — B9789 Other viral agents as the cause of diseases classified elsewhere: Secondary | ICD-10-CM

## 2022-05-25 MED ORDER — AZELASTINE HCL 0.1 % NA SOLN: 1.0000 | Freq: Two times a day (BID) | NASAL | 0 refills | Status: DC

## 2022-05-25 NOTE — Progress Notes (Signed)

## 2022-05-25 NOTE — Progress Notes (Signed)
I have spent 5 minutes in review of e-visit questionnaire, review and updating patient chart, medical decision making and response to patient.   Loreli Debruler Cody Quoc Tome, PA-C    

## 2022-05-26 ENCOUNTER — Emergency Department: Payer: BC Managed Care – PPO

## 2022-05-26 ENCOUNTER — Other Ambulatory Visit: Payer: Self-pay

## 2022-05-26 ENCOUNTER — Inpatient Hospital Stay: Payer: BC Managed Care – PPO

## 2022-05-26 ENCOUNTER — Inpatient Hospital Stay
Admission: EM | Admit: 2022-05-26 | Discharge: 2022-05-29 | DRG: 872 | Disposition: A | Discharge status: Home / Self Care | Payer: BC Managed Care – PPO | Attending: Osteopathic Medicine | Admitting: Osteopathic Medicine

## 2022-05-26 DIAGNOSIS — Z975 Presence of (intrauterine) contraceptive device: Secondary | ICD-10-CM

## 2022-05-26 DIAGNOSIS — D563 Thalassemia minor: Secondary | ICD-10-CM | POA: Diagnosis present

## 2022-05-26 DIAGNOSIS — E876 Hypokalemia: Secondary | ICD-10-CM | POA: Diagnosis present

## 2022-05-26 DIAGNOSIS — E871 Hypo-osmolality and hyponatremia: Secondary | ICD-10-CM | POA: Diagnosis present

## 2022-05-26 DIAGNOSIS — R112 Nausea with vomiting, unspecified: Secondary | ICD-10-CM | POA: Diagnosis not present

## 2022-05-26 DIAGNOSIS — Z20822 Contact with and (suspected) exposure to covid-19: Secondary | ICD-10-CM | POA: Diagnosis present

## 2022-05-26 DIAGNOSIS — E860 Dehydration: Secondary | ICD-10-CM | POA: Diagnosis present

## 2022-05-26 DIAGNOSIS — A419 Sepsis, unspecified organism: Secondary | ICD-10-CM | POA: Diagnosis present

## 2022-05-26 DIAGNOSIS — N760 Acute vaginitis: Secondary | ICD-10-CM | POA: Diagnosis present

## 2022-05-26 DIAGNOSIS — E878 Other disorders of electrolyte and fluid balance, not elsewhere classified: Secondary | ICD-10-CM | POA: Diagnosis not present

## 2022-05-26 DIAGNOSIS — N10 Acute tubulo-interstitial nephritis: Secondary | ICD-10-CM | POA: Diagnosis present

## 2022-05-26 DIAGNOSIS — Z79899 Other long term (current) drug therapy: Secondary | ICD-10-CM

## 2022-05-26 DIAGNOSIS — B9689 Other specified bacterial agents as the cause of diseases classified elsewhere: Secondary | ICD-10-CM | POA: Diagnosis present

## 2022-05-26 DIAGNOSIS — R079 Chest pain, unspecified: Secondary | ICD-10-CM | POA: Diagnosis present

## 2022-05-26 DIAGNOSIS — R197 Diarrhea, unspecified: Secondary | ICD-10-CM | POA: Diagnosis not present

## 2022-05-26 DIAGNOSIS — A084 Viral intestinal infection, unspecified: Secondary | ICD-10-CM | POA: Diagnosis present

## 2022-05-26 DIAGNOSIS — N12 Tubulo-interstitial nephritis, not specified as acute or chronic: Principal | ICD-10-CM

## 2022-05-26 DIAGNOSIS — A4151 Sepsis due to Escherichia coli [E. coli]: Secondary | ICD-10-CM | POA: Diagnosis present

## 2022-05-26 LAB — MONONUCLEOSIS SCREEN: Mono Screen: NEGATIVE

## 2022-05-26 LAB — CBC
HCT: 37.4 % (ref 36.0–46.0)
Hemoglobin: 12.4 g/dL (ref 12.0–15.0)
MCH: 30 pg (ref 26.0–34.0)
MCHC: 33.2 g/dL (ref 30.0–36.0)
MCV: 90.6 fL (ref 80.0–100.0)
Platelets: 267 10*3/uL (ref 150–400)
RBC: 4.13 MIL/uL (ref 3.87–5.11)
RDW: 11.5 % (ref 11.5–15.5)
WBC: 19.4 10*3/uL — ABNORMAL HIGH (ref 4.0–10.5)
nRBC: 0 % (ref 0.0–0.2)

## 2022-05-26 LAB — LIPASE, BLOOD: Lipase: 24 U/L (ref 11–51)

## 2022-05-26 LAB — BASIC METABOLIC PANEL
Anion gap: 8 (ref 5–15)
Anion gap: 7 (ref 5–15)
BUN: 5 mg/dL — ABNORMAL LOW (ref 6–20)
BUN: <5 mg/dL — ABNORMAL LOW (ref 6–20)
CO2: 23 mmol/L (ref 22–32)
CO2: 23 mmol/L (ref 22–32)
Calcium: 8 mg/dL — ABNORMAL LOW (ref 8.9–10.3)
Calcium: 8.2 mg/dL — ABNORMAL LOW (ref 8.9–10.3)
Chloride: 104 mmol/L (ref 98–111)
Chloride: 107 mmol/L (ref 98–111)
Creatinine, Ser: 0.72 mg/dL (ref 0.44–1.00)
Creatinine, Ser: 0.82 mg/dL (ref 0.44–1.00)
GFR, Estimated: >60 mL/min (ref >60)
GFR, Estimated: >60 mL/min (ref >60)
Glucose, Bld: 112 mg/dL — ABNORMAL HIGH (ref 70–99)
Glucose, Bld: 106 mg/dL — ABNORMAL HIGH (ref 70–99)
Potassium: 3.3 mmol/L — ABNORMAL LOW (ref 3.5–5.1)
Potassium: 3.9 mmol/L (ref 3.5–5.1)
Sodium: 135 mmol/L (ref 135–145)
Sodium: 137 mmol/L (ref 135–145)

## 2022-05-26 LAB — OSMOLALITY: Osmolality: 281 mOsm/kg (ref 275–295)

## 2022-05-26 LAB — HEMOGLOBIN A1C
Hgb A1c MFr Bld: 4.6 % — ABNORMAL LOW (ref 4.8–5.6)
Mean Plasma Glucose: 85.32 mg/dL

## 2022-05-26 LAB — LIPID PANEL
Cholesterol: 113 mg/dL (ref 0–200)
HDL: 62 mg/dL (ref >40)
LDL Cholesterol: 45 mg/dL (ref 0–99)
Total CHOL/HDL Ratio: 1.8 RATIO
Triglycerides: 30 mg/dL (ref <150)
VLDL: 6 mg/dL (ref 0–40)

## 2022-05-26 LAB — URINALYSIS, ROUTINE W REFLEX MICROSCOPIC
Bilirubin Urine: NEGATIVE
Glucose, UA: NEGATIVE mg/dL
Ketones, ur: 5 mg/dL — AB
Nitrite: NEGATIVE
Protein, ur: 30 mg/dL — AB
Specific Gravity, Urine: 1.002 — ABNORMAL LOW (ref 1.005–1.030)
WBC, UA: >50 WBC/hpf — ABNORMAL HIGH (ref 0–5)
pH: 7 (ref 5.0–8.0)

## 2022-05-26 LAB — COMPREHENSIVE METABOLIC PANEL
ALT: 13 U/L (ref 0–44)
AST: 21 U/L (ref 15–41)
Albumin: 3.9 g/dL (ref 3.5–5.0)
Alkaline Phosphatase: 49 U/L (ref 38–126)
Anion gap: 12 (ref 5–15)
BUN: <5 mg/dL — ABNORMAL LOW (ref 6–20)
CO2: 23 mmol/L (ref 22–32)
Calcium: 8.9 mg/dL (ref 8.9–10.3)
Chloride: 94 mmol/L — ABNORMAL LOW (ref 98–111)
Creatinine, Ser: 0.88 mg/dL (ref 0.44–1.00)
GFR, Estimated: >60 mL/min (ref >60)
Glucose, Bld: 126 mg/dL — ABNORMAL HIGH (ref 70–99)
Potassium: 2.7 mmol/L — CL (ref 3.5–5.1)
Sodium: 129 mmol/L — ABNORMAL LOW (ref 135–145)
Total Bilirubin: 1.1 mg/dL (ref 0.3–1.2)
Total Protein: 7.6 g/dL (ref 6.5–8.1)

## 2022-05-26 LAB — MAGNESIUM: Magnesium: 1.2 mg/dL — ABNORMAL LOW (ref 1.7–2.4)

## 2022-05-26 LAB — WET PREP, GENITAL
Sperm: NONE SEEN
Trich, Wet Prep: NONE SEEN
WBC, Wet Prep HPF POC: >=10 — AB (ref <10)
Yeast Wet Prep HPF POC: NONE SEEN

## 2022-05-26 LAB — HCG, QUANTITATIVE, PREGNANCY: hCG, Beta Chain, Quant, S: <1 m[IU]/mL (ref <5)

## 2022-05-26 LAB — CHLAMYDIA/NGC RT PCR (ARMC ONLY)
Chlamydia Tr: NOT DETECTED
N gonorrhoeae: NOT DETECTED

## 2022-05-26 LAB — PROTIME-INR
INR: 1.2 (ref 0.8–1.2)
Prothrombin Time: 15.3 seconds — ABNORMAL HIGH (ref 11.4–15.2)

## 2022-05-26 LAB — OSMOLALITY, URINE: Osmolality, Ur: 87 mOsm/kg — ABNORMAL LOW (ref 300–900)

## 2022-05-26 LAB — LACTIC ACID, PLASMA
Lactic Acid, Venous: 1.1 mmol/L (ref 0.5–1.9)
Lactic Acid, Venous: 1.1 mmol/L (ref 0.5–1.9)

## 2022-05-26 LAB — RESP PANEL BY RT-PCR (FLU A&B, COVID) ARPGX2
Influenza A by PCR: NEGATIVE
Influenza B by PCR: NEGATIVE
SARS Coronavirus 2 by RT PCR: NEGATIVE

## 2022-05-26 LAB — PROCALCITONIN: Procalcitonin: 17.82 ng/mL

## 2022-05-26 LAB — TROPONIN I (HIGH SENSITIVITY)
Troponin I (High Sensitivity): 7 ng/L (ref <18)
Troponin I (High Sensitivity): 5 ng/L (ref <18)
Troponin I (High Sensitivity): 5 ng/L (ref <18)

## 2022-05-26 LAB — POC URINE PREG, ED: Preg Test, Ur: NEGATIVE

## 2022-05-26 LAB — SODIUM, URINE, RANDOM: Sodium, Ur: <10 mmol/L

## 2022-05-26 LAB — PHOSPHORUS: Phosphorus: <1 mg/dL — CL (ref 2.5–4.6)

## 2022-05-26 MED ORDER — ACETAMINOPHEN 325 MG PO TABS
650.0000 mg | ORAL_TABLET | Freq: Four times a day (QID) | ORAL | Status: DC | PRN
  Administered 2022-05-26 – 2022-05-28 (×4): 650 mg via ORAL
  Filled 2022-05-26 (×4): qty 2

## 2022-05-26 MED ORDER — METRONIDAZOLE 500 MG/100ML IV SOLN: 500.0000 mg | Freq: Once | INTRAVENOUS | Status: DC

## 2022-05-26 MED ORDER — SODIUM CHLORIDE 0.9 % IV SOLN: INTRAVENOUS | Status: DC

## 2022-05-26 MED ORDER — SODIUM CHLORIDE 0.9 % IV BOLUS
1000.0000 mL | Freq: Once | INTRAVENOUS | Status: AC
  Administered 2022-05-26: 1000 mL via INTRAVENOUS

## 2022-05-26 MED ORDER — ONDANSETRON HCL 4 MG/2ML IJ SOLN
4.0000 mg | Freq: Three times a day (TID) | INTRAMUSCULAR | Status: DC | PRN
  Administered 2022-05-27 (×2): 4 mg via INTRAVENOUS
  Filled 2022-05-26 (×3): qty 2

## 2022-05-26 MED ORDER — ASPIRIN 81 MG PO TBEC
81.0000 mg | DELAYED_RELEASE_TABLET | Freq: Every day | ORAL | Status: DC
  Administered 2022-05-26 – 2022-05-29 (×4): 81 mg via ORAL
  Filled 2022-05-26 (×4): qty 1

## 2022-05-26 MED ORDER — POTASSIUM CHLORIDE 10 MEQ/100ML IV SOLN
10.0000 meq | INTRAVENOUS | Status: AC
  Administered 2022-05-26 (×2): 10 meq via INTRAVENOUS
  Filled 2022-05-26 (×3): qty 100

## 2022-05-26 MED ORDER — MAGNESIUM SULFATE 4 GM/100ML IV SOLN
4.0000 g | Freq: Once | INTRAVENOUS | Status: DC
  Filled 2022-05-26: qty 100

## 2022-05-26 MED ORDER — METRONIDAZOLE 500 MG/100ML IV SOLN
500.0000 mg | Freq: Two times a day (BID) | INTRAVENOUS | Status: DC
  Administered 2022-05-26 – 2022-05-27 (×4): 500 mg via INTRAVENOUS
  Filled 2022-05-26 (×5): qty 100

## 2022-05-26 MED ORDER — IOHEXOL 350 MG/ML SOLN
50.0000 mL | Freq: Once | INTRAVENOUS | Status: AC | PRN
  Administered 2022-05-26: 50 mL via INTRAVENOUS

## 2022-05-26 MED ORDER — ONDANSETRON HCL 4 MG/2ML IJ SOLN
4.0000 mg | Freq: Once | INTRAMUSCULAR | Status: AC
  Administered 2022-05-26: 4 mg via INTRAVENOUS
  Filled 2022-05-26: qty 2

## 2022-05-26 MED ORDER — POTASSIUM PHOSPHATES 15 MMOLE/5ML IV SOLN
30.0000 mmol | Freq: Once | INTRAVENOUS | Status: AC
  Administered 2022-05-26: 30 mmol via INTRAVENOUS
  Filled 2022-05-26: qty 10

## 2022-05-26 MED ORDER — MAGNESIUM SULFATE 2 GM/50ML IV SOLN
2.0000 g | Freq: Once | INTRAVENOUS | Status: AC
Start: 2022-05-26 — End: 2022-05-26
  Administered 2022-05-26: 2 g via INTRAVENOUS
  Filled 2022-05-26: qty 50

## 2022-05-26 MED ORDER — SODIUM CHLORIDE 0.9 % IV SOLN
2.0000 g | INTRAVENOUS | Status: DC
  Administered 2022-05-26 – 2022-05-27 (×2): 2 g via INTRAVENOUS
  Filled 2022-05-26 (×3): qty 20

## 2022-05-26 MED ORDER — MAGNESIUM SULFATE 2 GM/50ML IV SOLN
2.0000 g | Freq: Once | INTRAVENOUS | Status: DC
  Administered 2022-05-26: 2 g via INTRAVENOUS
  Filled 2022-05-26: qty 50

## 2022-05-26 MED ORDER — PANTOPRAZOLE SODIUM 40 MG IV SOLR
40.0000 mg | Freq: Once | INTRAVENOUS | Status: AC
  Administered 2022-05-26: 40 mg via INTRAVENOUS
  Filled 2022-05-26: qty 10

## 2022-05-26 MED ORDER — IOHEXOL 300 MG/ML  SOLN
100.0000 mL | Freq: Once | INTRAMUSCULAR | Status: AC | PRN
  Administered 2022-05-26: 100 mL via INTRAVENOUS

## 2022-05-26 MED ORDER — LACTATED RINGERS IV BOLUS
1000.0000 mL | Freq: Once | INTRAVENOUS | Status: AC
  Administered 2022-05-26: 1000 mL via INTRAVENOUS

## 2022-05-26 MED ORDER — IBUPROFEN 400 MG PO TABS
400.0000 mg | ORAL_TABLET | Freq: Three times a day (TID) | ORAL | Status: DC | PRN
  Administered 2022-05-26 – 2022-05-27 (×2): 400 mg via ORAL
  Filled 2022-05-26 (×2): qty 1

## 2022-05-26 MED ORDER — ENOXAPARIN SODIUM 40 MG/0.4ML IJ SOSY
40.0000 mg | PREFILLED_SYRINGE | INTRAMUSCULAR | Status: DC
  Administered 2022-05-26 – 2022-05-28 (×3): 40 mg via SUBCUTANEOUS
  Filled 2022-05-26 (×3): qty 0.4

## 2022-05-26 MED ORDER — MORPHINE SULFATE (PF) 2 MG/ML IV SOLN: 1.0000 mg | INTRAVENOUS | Status: DC | PRN

## 2022-05-26 MED ORDER — POTASSIUM CHLORIDE CRYS ER 20 MEQ PO TBCR
40.0000 meq | EXTENDED_RELEASE_TABLET | ORAL | Status: AC
  Administered 2022-05-26 (×2): 40 meq via ORAL
  Filled 2022-05-26 (×2): qty 2

## 2022-05-26 NOTE — Sepsis Progress Note (Signed)
Notified bedside nurse of need to draw lactic acid and Blood Cultures.. 

## 2022-05-26 NOTE — ED Provider Notes (Signed)
[facility] Provider Note    Event Date/Time   First MD Initiated Contact with Patient 05/26/22 [phone]     (approximate)   History   Abdominal Pain and Back Pain   HPI  Margaret Dunn is a 21 y.o. female with history of IUD who comes in with concerns of multiple symptoms.  She reports this started on Sunday after she ate some leftover food that was left out on Saturday.  She reports multiple symptoms including and tractable nausea, vomiting, chest chest pain, abdominal discomfort, headaches, chest pain, fevers.  She reports that she was seen by a telehealth visit yesterday where they diagnosed her with viral sinusitis and that she was prescribed some Flonase but even when she was spraying that she was still vomiting.  She denies any dysuria but just reports some decreased urination.   With mom out of the room she denies any history vaginal discharge.  She does report having a new partner since April no recent STD testing.  She denies any missed periods and last had a period back in July.   Physical Exam   Triage Vital Signs: ED Triage Vitals [05/26/22 0842]  Enc Vitals Group     BP 113/70     Pulse Rate (!) 116     Resp 16     Temp 99.1 F (37.3 C)     Temp Source Oral     SpO2 100 %     Weight 111 lb (50.3 kg)     Height '5\' 4"'$  (1.626 m)     Head Circumference      Peak Flow      Pain Score 8     Pain Loc      Pain Edu?      Excl. in [place]?     Most recent vital signs: Vitals:   05/26/22 0842  BP: 113/70  Pulse: (!) 116  Resp: 16  Temp: 99.1 F (37.3 C)  SpO2: 100%     General: Awake, no distress.  CV:  Good peripheral perfusion.  Tachycardic Resp:  Normal effort.  Abd:  She reports that pushing on her epigastric area causes nausea.  No obvious right lower or left lower quadrant tenderness Other:     ED Results / Procedures / Treatments   Labs (all labs ordered are listed, but only abnormal results are displayed) Labs Reviewed   CBC - Abnormal; Notable for the following components:      Result Value   WBC 19.4 (*)    All other components within normal limits  RESP PANEL BY RT-PCR (FLU A&B, COVID) ARPGX2  CHLAMYDIA/NGC RT PCR ([facility] ONLY)            WET PREP, GENITAL  LIPASE, BLOOD  COMPREHENSIVE METABOLIC PANEL  URINALYSIS, ROUTINE W REFLEX MICROSCOPIC  HCG, QUANTITATIVE, PREGNANCY  PROCALCITONIN  POC URINE PREG, ED  TROPONIN I (HIGH SENSITIVITY)     EKG  My interpretation of EKG:  Sinus tachycardia rate of 103 without any ST elevation, T wave inversions 3 aVF V3, normal intervals  RADIOLOGY I have reviewed the xray personally and interpreted no evidence of any pneumonia  PROCEDURES:  Critical Care performed: Yes, see critical care procedure note(s)  .1-3 Lead EKG Interpretation  Performed by: Vanessa [place], MD Authorized by: Vanessa [place], MD     Interpretation: abnormal     ECG rate:  110   ECG rate assessment: tachycardic     Rhythm: sinus tachycardia  Ectopy: none     Conduction: normal   .Critical Care  Performed by: Vanessa [place], MD Authorized by: Vanessa [place], MD   Critical care provider statement:    Critical care time (minutes):  30   Critical care was necessary to treat or prevent imminent or life-threatening deterioration of the following conditions:  Sepsis   Critical care was time spent personally by me on the following activities:  Development of treatment plan with patient or surrogate, discussions with consultants, evaluation of patient's response to treatment, examination of patient, ordering and review of laboratory studies, ordering and review of radiographic studies, ordering and performing treatments and interventions, pulse oximetry, re-evaluation of patient's condition and review of old [place] ED: Medications  potassium chloride 10 mEq in 100 mL IVPB (10 mEq Intravenous New Bag/Given 05/26/22 1119)  cefTRIAXone (ROCEPHIN) 2 g in sodium  chloride 0.9 % 100 mL IVPB (has no administration in time range)  magnesium sulfate IVPB 4 g 100 mL (has no administration in time range)  potassium chloride SA (KLOR-CON M) CR tablet 40 mEq (has no administration in time range)  ondansetron (ZOFRAN) injection 4 mg (has no administration in time range)  acetaminophen (TYLENOL) tablet 650 mg (has no administration in time range)  sodium chloride 0.9 % bolus 1,000 mL (0 mLs Intravenous Stopped 05/26/22 1118)  lactated ringers bolus 1,000 mL (1,000 mLs Intravenous New Bag/Given 05/26/22 0922)  ondansetron (ZOFRAN) injection 4 mg (4 mg Intravenous Given 05/26/22 0915)  pantoprazole (PROTONIX) injection 40 mg (40 mg Intravenous Given 05/26/22 0914)  iohexol (OMNIPAQUE) 300 MG/ML solution 100 mL (100 mLs Intravenous Contrast Given 05/26/22 1055)     IMPRESSION / MDM / [place] / ED COURSE  I reviewed the triage vital signs and the nursing notes.   Patient's presentation is most consistent with acute presentation with potential threat to life or bodily function.   Differential includes COVID, flu, viral illness, pneumonia, UTI  Patient's labs came back with significantly elevated white count and procalcitonin making me concerned for the possibility of sepsis, bacteremia.  She denies any urinary symptoms but urine looks concerning for UTI will send for culture.  Given the concern for abdominal back pain will get CT imaging as well as CT head given the headaches.  She is got full range of motion of neck and neck is supple so I have lower suspicion for meningitis at this time  Labs show low potassium.  Her magnesium is low.  Patient CT scan concerning for pyelonephritis.  She is got some liquid noted in her stool but she denies any current diarrhea.  Given we now have a source I have done a sepsis alert and started patient on ceftriaxone.  I discussed with the hospital team for admission  Patient was also noted to be positive for BV so I have  started her on some Flagyl IV due to on ability to tolerate p.o.  The patient is on the cardiac monitor to evaluate for evidence of arrhythmia and/or significant heart rate changes.      FINAL CLINICAL IMPRESSION(S) / ED DIAGNOSES   Final diagnoses:  Pyelonephritis  Sepsis, due to unspecified organism, unspecified whether acute organ dysfunction present ([place])  Hypomagnesemia  Hypokalemia     Rx / DC Orders   ED Discharge Orders     None        Note:  This document was prepared using Dragon voice recognition software and may  include unintentional dictation errors.   Vanessa [place], MD 05/26/22 1212

## 2022-05-26 NOTE — Sepsis Progress Note (Signed)
ELink tracking the code sepsis.

## 2022-05-26 NOTE — Assessment & Plan Note (Signed)
-  see above 

## 2022-05-26 NOTE — Assessment & Plan Note (Addendum)
Sepsis due to acute pyelonephritis and bacterial vaginosis:  POA w/ WBC 19.4, heart rate 116.  Lactic acid 1.1 --> 1.1. 08/11: Blood cultures no growth x 2 days.  Urine culture positive E. coli, sensitive to ciprofloxacin.  Temperature 99.8, white blood cells down from 12.5-8.6, other VS normal, no longer meeting sepsis/SIRS criteria  Admitted to [place] bed as inpatient  IV Rocephin for pyelonephritis --> po ciprofloxacin  IV Flagyl for bacterial vaginosis --> po flagyl on discharge

## 2022-05-26 NOTE — Assessment & Plan Note (Signed)
See above

## 2022-05-26 NOTE — ED Triage Notes (Signed)
Pt to ED From [facility] for headache, back pain, generalized abd pain for the past few days. Emesis x8 in past 24 hours.

## 2022-05-26 NOTE — Assessment & Plan Note (Addendum)
Sodium 129 on admission, improved with rehydration  Serial BMP show improvement

## 2022-05-26 NOTE — H&P (Addendum)
History and Physical    Margaret Dunn BJS:[id number] DOB: 09-Aug-2001 DOA: 05/26/2022  Referring MD/NP/PA:   PCP: Delsa Grana, PA-C   Patient coming from:  The patient is coming from home.  At baseline, pt is independent for most of ADL.        Chief Complaint: body aches, nausea, vomiting, diarrhea, chest pain,  HPI: Margaret Dunn is a 21 y.o. 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  Patient states that she has been sick for more than 3 days.  She has bodyaches, subjective fever, chills, headache.  Her body temperature is 99.1 in ED.  She also reports nausea, vomiting, diarrhea.  She states that she has 2 or 3 times of nonbilious nonbloody vomiting, and 2 or 3 times of watery diarrhea today.  She has mild cramping lower abdominal pain.  She also reports chest pain, which is located in front chest, sharp, pleuritic, aggravated by deep breath, 3 out of 10 in severity, nonradiating.  No chest wall tenderness.  No cough or shortness breath.  No tenderness in calf areas. Patient has mild urinary frequency, no dysuria or burning on urination.  No hematuria.  No vaginal discharge. She reports having a new partner since April, no recent STD testing.    Data reviewed independently and ED Course: pt was found to have WBC 19.4, troponin level 7, urinalysis (hazy appearance, large amount of leukocyte, rare bacteria, WBC 50, squamous cell 6-10), negative pregnancy test, negative COVID PCR, negative Neisseria/GC/chlamydia prob, positive clue cells on Wet Prep, negative mononucleosis screen, negative group A strep PCR, potassium 2.7, sodium 129, magnesium 1.2, phosphorus < 1.0. Temperature 99.1, blood pressure 128/73, heart rate 116, RR 17, oxygen saturation 100% on room air.  Chest x-ray negative.  CT of head is negative.  CT of abdomen/pelvis findings are suggestive for  pyelonephritis. Patient is admitted to [place] bed as inpatient.   CT-abd/pelvis 1. Striated nephrograms bilaterally, left worse than right, suggestive of pyelonephritis. Correlation with urinalysis is recommended. 2. Fluid-filled loops of large and small bowel with air-fluid levels, suggestive of a diarrheal state. No findings to suggest bowel obstruction.    EKG: I have personally reviewed.  Sinus rhythm, QTc 426, LAD, RAD, T wave inversion in lead III/aVF, V3-V4.  Review of Systems:   General: has subjective fevers, chills, no body weight gain, has poor appetite, has fatigue HEENT: no blurry vision, hearing changes or sore throat Respiratory: no dyspnea, coughing, wheezing CV: has chest pain, no palpitations GI: has nausea, vomiting, lower abdominal pain, diarrhea, no constipation GU: no dysuria, burning on urination, has increased urinary frequency, no hematuria  Ext: no leg edema Neuro: no unilateral weakness, numbness, or tingling, no vision change or hearing loss Skin: no rash, no skin tear. MSK: No muscle spasm, no deformity, no limitation of range of movement in spin Heme: No easy bruising.  Travel history: No recent long distant travel.   Allergy: No Known Allergies  Past Medical History:  Diagnosis Date   Anxiety    Iron deficiency anemia 10/22/2016   Iron deficiency anemia due to chronic blood loss 03/18/2021   Neutropenia ([place]) 10/22/2016   RBC microcytosis 11/09/2016   Probably alpha thal trait    Past Surgical History:  Procedure Laterality Date   WISDOM TOOTH EXTRACTION      Social History:  reports that she has never smoked. She has never used smokeless tobacco. She  reports current alcohol use. She reports that she does not use drugs.  Family History:  Family History  Problem Relation Age of Onset   Healthy Mother    Healthy Father    Healthy Sister    Healthy Sister    Healthy Sister    Breast cancer Neg Hx    Ovarian cancer Neg Hx    Colon  cancer Neg Hx      Prior to Admission medications   Medication Sig Start Date End Date Taking? Authorizing Provider  azelastine (ASTELIN) 0.1 % nasal spray Place 1 spray into both nostrils 2 (two) times daily. Use in each nostril as directed 05/25/22   Brunetta Jeans, PA-C  dicyclomine (BENTYL) 20 MG tablet Take 1 tablet (20 mg total) by mouth 3 (three) times daily as needed for spasms (abd cramping). 07/18/20   Delsa Grana, PA-C  fluticasone (FLONASE) 50 MCG/ACT nasal spray Place 2 sprays into both nostrils daily. 08/12/21   Brunetta Jeans, PA-C  PARAGARD INTRAUTERINE COPPER IU by Intrauterine route. 10/23/18   [provider]    Physical Exam: Vitals:   05/26/22 1258 05/26/22 1300 05/26/22 1335 05/26/22 1709  BP:  131/77 122/68 125/80  Pulse: 92 91 85 (!) 101  Resp: '16 18 18 16  '$ Temp:  98.5 F (36.9 C) 99 F (37.2 C) 98.4 F (36.9 C)  TempSrc:  Oral Oral Oral  SpO2: 100% 100% 100% 100%  Weight:      Height:       General: Not in acute distress.  Dry mucosal membrane HEENT:       Eyes: PERRL, EOMI, no scleral icterus.       ENT: No discharge from the ears and nose, no pharynx injection, no tonsillar enlargement.        Neck: No JVD, no bruit, no mass felt. Heme: No neck lymph node enlargement. Cardiac: S1/S2, RRR, No murmurs, No gallops or rubs. Respiratory: No rales, wheezing, rhonchi or rubs. GI: Soft, nondistended, has lower abdominal tenderness, no rebound pain, no organomegaly, BS present. GU: No hematuria Ext: No pitting leg edema bilaterally. 1+DP/PT pulse bilaterally. Musculoskeletal: No joint deformities, No joint redness or warmth, no limitation of ROM in spin. Skin: No rashes.  Neuro: Alert, oriented X3, cranial nerves II-XII grossly intact, moves all extremities normally.  Psych: Patient is not psychotic, no suicidal or hemocidal ideation.  Labs on Admission: I have personally reviewed following labs and imaging studies  CBC: Recent Labs  Lab  05/26/22 0844  WBC 19.4*  HGB 12.4  HCT 37.4  MCV 90.6  PLT 852   Basic Metabolic Panel: Recent Labs  Lab 05/26/22 0844 05/26/22 0907 05/26/22 1344  NA 129*  --  135  K 2.7*  --  3.3*  CL 94*  --  104  CO2 23  --  23  GLUCOSE 126*  --  112*  BUN <5*  --  5*  CREATININE 0.88  --  0.72  CALCIUM 8.9  --  8.0*  MG  --  1.2*  --   PHOS <1.0*  --   --    GFR: Estimated Creatinine Clearance: 86.8 mL/min (by C-G formula based on SCr of 0.72 mg/dL). Liver Function Tests: Recent Labs  Lab 05/26/22 0844  AST 21  ALT 13  ALKPHOS 49  BILITOT 1.1  PROT 7.6  ALBUMIN 3.9   Recent Labs  Lab 05/26/22 0844  LIPASE 24   No results for input(s): "AMMONIA" in the last  168 hours. Coagulation Profile: Recent Labs  Lab 05/26/22 1344  INR 1.2   Cardiac Enzymes: No results for input(s): "CKTOTAL", "CKMB", "CKMBINDEX", "TROPONINI" in the last 168 hours. BNP (last 3 results) No results for input(s): "PROBNP" in the last 8760 hours. HbA1C: No results for input(s): "HGBA1C" in the last 72 hours. CBG: No results for input(s): "GLUCAP" in the last 168 hours. Lipid Profile: Recent Labs    05/26/22 1344  CHOL 113  HDL 62  LDLCALC 45  TRIG 30  CHOLHDL 1.8   Thyroid Function Tests: No results for input(s): "TSH", "T4TOTAL", "FREET4", "T3FREE", "THYROIDAB" in the last 72 hours. Anemia Panel: No results for input(s): "VITAMINB12", "FOLATE", "FERRITIN", "TIBC", "IRON", "RETICCTPCT" in the last 72 hours. Urine analysis:    Component Value Date/Time   COLORURINE YELLOW (A) 05/26/2022 0953   APPEARANCEUR HAZY (A) 05/26/2022 0953   LABSPEC 1.002 (L) 05/26/2022 0953   PHURINE 7.0 05/26/2022 0953   GLUCOSEU NEGATIVE 05/26/2022 0953   HGBUR MODERATE (A) 05/26/2022 0953   BILIRUBINUR NEGATIVE 05/26/2022 0953   KETONESUR 5 (A) 05/26/2022 0953   PROTEINUR 30 (A) 05/26/2022 0953   NITRITE NEGATIVE 05/26/2022 0953   LEUKOCYTESUR LARGE (A) 05/26/2022 0953   Sepsis  Labs: '@LABRCNTIP'$ (procalcitonin:4,lacticidven:4) ) Recent Results (from the past 240 hour(s))  Resp Panel by RT-PCR (Flu A&B, Covid)     Status: None   Collection Time: 05/26/22  9:07 AM   Specimen: Nasal Swab  Result Value Ref Range Status   SARS Coronavirus 2 by RT PCR NEGATIVE NEGATIVE Final    Comment: (NOTE) SARS-CoV-2 target nucleic acids are NOT DETECTED.  The SARS-CoV-2 RNA is generally detectable in upper respiratory specimens during the acute phase of infection. The lowest concentration of SARS-CoV-2 viral copies this assay can detect is 138 copies/mL. A negative result does not preclude SARS-Cov-2 infection and should not be used as the sole basis for treatment or other patient management decisions. A negative result may occur with  improper specimen collection/handling, submission of specimen other than nasopharyngeal swab, presence of viral mutation(s) within the areas targeted by this assay, and inadequate number of viral copies(<138 copies/mL). A negative result must be combined with clinical observations, patient history, and epidemiological information. The expected result is Negative.  Fact Sheet for Patients:  EntrepreneurPulse.com.au  Fact Sheet for Healthcare Providers:  IncredibleEmployment.be  This test is no t yet approved or cleared by the [place] FDA and  has been authorized for detection and/or diagnosis of SARS-CoV-2 by FDA under an Emergency Use Authorization (EUA). This EUA will remain  in effect (meaning this test can be used) for the duration of the COVID-19 declaration under Section 564(b)(1) of the Act, 21 U.S.C.section 360bbb-3(b)(1), unless the authorization is terminated  or revoked sooner.       Influenza A by PCR NEGATIVE NEGATIVE Final   Influenza B by PCR NEGATIVE NEGATIVE Final    Comment: (NOTE) The Xpert Xpress SARS-CoV-2/FLU/RSV plus assay is intended as an aid in the diagnosis of  influenza from Nasopharyngeal swab specimens and should not be used as a sole basis for treatment. Nasal washings and aspirates are unacceptable for Xpert Xpress SARS-CoV-2/FLU/RSV testing.  Fact Sheet for Patients: EntrepreneurPulse.com.au  Fact Sheet for Healthcare Providers: IncredibleEmployment.be  This test is not yet approved or cleared by the [place] FDA and has been authorized for detection and/or diagnosis of SARS-CoV-2 by FDA under an Emergency Use Authorization (EUA). This EUA will remain in effect (meaning this test can be used)  for the duration of the COVID-19 declaration under Section 564(b)(1) of the Act, 21 U.S.C. section 360bbb-3(b)(1), unless the authorization is terminated or revoked.  Performed at [facility], [address] [place], [place] [address]   [place] rt PCR [facility] only)     Status: None   Collection Time: 05/26/22  9:53 AM  Result Value Ref Range Status   Specimen source GC/Chlam ENDOCERVICAL  Final   Chlamydia Tr NOT DETECTED NOT DETECTED Final   N gonorrhoeae NOT DETECTED NOT DETECTED Final    Comment: (NOTE) This CT/NG assay has not been evaluated in patients with a history of  hysterectomy. Performed at [facility], [address] [place], [place] [address]   Wet prep, genital     Status: Abnormal   Collection Time: 05/26/22  9:53 AM  Result Value Ref Range Status   Yeast Wet Prep HPF POC NONE SEEN NONE SEEN Final   Trich, Wet Prep NONE SEEN NONE SEEN Final   Clue Cells Wet Prep HPF POC PRESENT (A) NONE SEEN Final   WBC, Wet Prep HPF POC >=10 (A) <10 Final   Sperm NONE SEEN  Final    Comment: Performed at [facility], [address] [place], [place] [address]     Radiological Exams on Admission: CT Angio Chest Pulmonary Embolism (PE) W or WO Contrast  Result Date: 05/26/2022 CLINICAL DATA:  Pulmonary embolism suspected. EXAM: CT ANGIOGRAPHY CHEST WITH  CONTRAST TECHNIQUE: Multidetector CT imaging of the chest was performed using the standard protocol during bolus administration of intravenous contrast. Multiplanar CT image reconstructions and MIPs were obtained to evaluate the vascular anatomy. RADIATION DOSE REDUCTION: This exam was performed according to the departmental dose-optimization program which includes automated exposure control, adjustment of the mA and/or kV according to patient size and/or use of iterative reconstruction technique. CONTRAST:  3m OMNIPAQUE IOHEXOL 350 MG/ML SOLN COMPARISON:  Chest radiography same day FINDINGS: Cardiovascular: Heart size is normal. No pericardial fluid. No coronary artery calcification or aortic atherosclerotic calcification. Pulmonary arterial opacification is excellent. No pulmonary emboli. Mediastinum/Nodes: No mass or lymphadenopathy. Lungs/Pleura: No pleural effusion. The lungs are clear. No infiltrate or collapse. No mass or nodule. Upper Abdomen: Normal Musculoskeletal: Normal Review of the MIP images confirms the above findings. IMPRESSION: Normal examination.  No pulmonary emboli or other chest pathology. Electronically Signed   By: MNelson ChimesM.D.   On: 05/26/2022 15:59   CT ABDOMEN PELVIS W CONTRAST  Result Date: 05/26/2022 CLINICAL DATA:  Abdominal pain, acute, nonlocalized EXAM: CT ABDOMEN AND PELVIS WITH CONTRAST TECHNIQUE: Multidetector CT imaging of the abdomen and pelvis was performed using the standard protocol following bolus administration of intravenous contrast. RADIATION DOSE REDUCTION: This exam was performed according to the departmental dose-optimization program which includes automated exposure control, adjustment of the mA and/or kV according to patient size and/or use of iterative reconstruction technique. CONTRAST:  1092mOMNIPAQUE IOHEXOL 300 MG/ML  SOLN COMPARISON:  Same-day abdominal radiographs FINDINGS: Lower chest: Included lung bases are clear.  Heart size is normal.  Hepatobiliary: Subcentimeter low-density lesion within the right hepatic lobe, too small to characterize. Liver is otherwise normal in appearance. Unremarkable gallbladder. No hyperdense gallstone. No biliary dilatation. Pancreas: Unremarkable. No pancreatic ductal dilatation or surrounding inflammatory changes. Spleen: Normal in size without focal abnormality. Adrenals/Urinary Tract: Unremarkable adrenal glands. Striated nephrograms bilaterally, left worse than right. No intrarenal fluid collection. No renal stone or hydronephrosis. Urinary bladder is moderately distended but appears otherwise within normal limits.  Stomach/Bowel: Stomach within normal limits. There are fluid-filled loops of large and small bowel with air-fluid levels. No transition point to suggest a bowel obstruction. Normal appendix within the right lower quadrant (series 5, image 31). No focal bowel wall thickening or inflammatory changes. Vascular/Lymphatic: No significant vascular findings are present. No enlarged abdominal or pelvic lymph nodes. Reproductive: Normal appearing uterus containing IUD. No adnexal abnormalities. Other: No free fluid. No abdominopelvic fluid collection. No pneumoperitoneum. No abdominal wall hernia. Musculoskeletal: No acute or significant osseous findings. IMPRESSION: 1. Striated nephrograms bilaterally, left worse than right, suggestive of pyelonephritis. Correlation with urinalysis is recommended. 2. Fluid-filled loops of large and small bowel with air-fluid levels, suggestive of a diarrheal state. No findings to suggest bowel obstruction. 3. Normal appendix. Electronically Signed   By: Davina Poke D.O.   On: 05/26/2022 11:16   CT HEAD WO CONTRAST (5MM)  Result Date: 05/26/2022 CLINICAL DATA:  Headache, chronic, new features or increased frequency EXAM: CT HEAD WITHOUT CONTRAST TECHNIQUE: Contiguous axial images were obtained from the base of the skull through the vertex without intravenous contrast.  RADIATION DOSE REDUCTION: This exam was performed according to the departmental dose-optimization program which includes automated exposure control, adjustment of the mA and/or kV according to patient size and/or use of iterative reconstruction technique. COMPARISON:  None Available. FINDINGS: Brain: No evidence of acute intracranial hemorrhage or extra-axial collection.No evidence of mass lesion/concerning mass effect.The ventricles are normal in size. Vascular: No hyperdense vessel or unexpected calcification. Skull: Normal. Negative for fracture or focal lesion. Sinuses/Orbits: No acute finding. Other: None. IMPRESSION: No acute intracranial abnormality. Electronically Signed   By: Maurine Simmering M.D.   On: 05/26/2022 11:12   DG Abd Portable 1 View  Result Date: 05/26/2022 CLINICAL DATA:  Abdominal pain EXAM: PORTABLE ABDOMEN - 1 VIEW COMPARISON:  None Available. FINDINGS: Gaseous distension of both large and small bowel loops within the abdomen with small-bowel dilatation up to 3.9 cm. No pathologic calcifications are identified. An IUD is position within the pelvis. No acute bony findings. IMPRESSION: Gaseous distension of both large and small bowel loops within the abdomen with small-bowel dilatation up to 3.9 cm. Findings favor reflect ileus versus enteritis. Developing obstruction not entirely excluded. Electronically Signed   By: Davina Poke D.O.   On: 05/26/2022 10:34   DG Chest 2 View  Result Date: 05/26/2022 CLINICAL DATA:  Sepsis EXAM: CHEST - 2 VIEW COMPARISON:  09/03/2021 FINDINGS: The heart size and mediastinal contours are within normal limits. Both lungs are clear. The visualized skeletal structures are unremarkable. IMPRESSION: No active cardiopulmonary disease. Electronically Signed   By: Davina Poke D.O.   On: 05/26/2022 10:29      Assessment/Plan Principal Problem:   Sepsis ([place]) Active Problems:   Acute pyelonephritis   Bacterial vaginosis   Chest pain   Nausea vomiting  and diarrhea   Electrolyte disturbance   Hyponatremia   Hypokalemia   Hypomagnesemia   Hypophosphatemia   Assessment and Plan: * Sepsis ([place]) Sepsis due to acute pyelonephritis and bacterial vaginosis: pt meets criteria for sepsis with WBC 19.4, heart rate 116.  Lactic acid 1.1 --> 1.1.  -Admitted to MedSurg bed as inpatient -IV Rocephin for pyelonephritis -IV Flagyl for bacterial vaginosis -Follow-up blood culture and urine culture -IV fluid: 1 L normal saline +1 L LR, then 125 cc/h of normal saline -Check procalcitonin level --> 17.82  Acute pyelonephritis - See above  Bacterial vaginosis - See above  Chest pain Patient has pleuritic  chest pain.  Chest x-ray negative.  CT angiogram negative for PE.  Patient is very low risk for ACS.  Initial troponin 7. -Aspirin 81 mg daily -As needed morphine -Check A1c, FLP -Trend troponin -will get 2d echo to r/o pericarditis  Nausea vomiting and diarrhea Likely viral gastroenteritis -Follow-up C. difficile and GI pathogen panel -As needed Zofran  Electrolyte disturbance Multiple electrolytes disturbance: Hypokalemia with potassium 2.7, hypomagnesemia with magnesium 1.2, hyponatremia with sodium 129, hypophosphatemia with phosphorus <1.0 -Repleted potassium, magnesium and phosphorus -IV fluid for hyponatremia (see below)  Hyponatremia Sodium 129. Likely due to GI loss and poor oral intake and dehydration  - Will check urine sodium, urine osmolality, serum osmolality. - IVF: 1L NS and 1L of LR in ED, will continue with IV normal saline at 125 mL/h of NS - f/u by BMP q8h - avoid over correction too fast due to risk of central pontine myelinolysis   Hypokalemia -see above  Hypomagnesemia -see above  Hypophosphatemia -see above          DVT ppx: SQ Lovenox  Code Status: Full code  Family Communication:  Yes, patient's mother   at bed side.   Disposition Plan:  Anticipate discharge back to previous  environment  Consults called:  none  Admission status and Level of care: Med-Surg:     as inpt       Severity of Illness:  The appropriate patient status for this patient is INPATIENT. Inpatient status is judged to be reasonable and necessary in order to provide the required intensity of service to ensure the patient's safety. The patient's presenting symptoms, physical exam findings, and initial radiographic and laboratory data in the context of their chronic comorbidities is felt to place them at high risk for further clinical deterioration. Furthermore, it is not anticipated that the patient will be medically stable for discharge from the hospital within 2 midnights of admission.   * I certify that at the point of admission it is my clinical judgment that the patient will require inpatient hospital care spanning beyond 2 midnights from the point of admission due to high intensity of service, high risk for further deterioration and high frequency of surveillance required.*       Date of Service 05/26/2022    Ivor Costa [facility]   If 7PM-7AM, please contact night-coverage [email or web address] 05/26/2022, 6:09 PM

## 2022-05-26 NOTE — Assessment & Plan Note (Addendum)
Likely viral gastroenteritis vs abx effect vs irritability   Follow-up C. difficile and GI pathogen panel --> negative  As needed Zofran  Imodium ordered

## 2022-05-26 NOTE — TOC Initial Note (Signed)
Transition of Care [facility]) - Initial/Assessment Note    Patient Details  Name: Margaret Dunn MRN: [id number] Date of Birth: 04-20-2001  Transition of [facility]) CM/SW Contact:    Beverly Sessions, RN Phone Number: 05/26/2022, 3:03 PM  Clinical Narrative:                   Transition of Care (TOC) Screening Note   Patient Details  Name: Margaret Dunn Date of Birth: 05-31-2001   Transition of Care [facility]) CM/SW Contact:    Beverly Sessions, RN Phone Number: 05/26/2022, 3:03 PM    Transition of Care Department [facility]) has reviewed patient and no TOC needs have been identified at this time. We will continue to monitor patient advancement through interdisciplinary progression rounds. If new patient transition needs arise, please place a TOC consult.         Patient Goals and CMS Choice        Expected Discharge Plan and Services                                                Prior Living Arrangements/Services                       Activities of Daily Living Home Assistive Devices/Equipment: Eyeglasses ADL Screening (condition at time of admission) Patient's cognitive ability adequate to safely complete daily activities?: Yes Is the patient deaf or have difficulty hearing?: No Does the patient have difficulty seeing, even when wearing glasses/contacts?: No Does the patient have difficulty concentrating, remembering, or making decisions?: No Patient able to express need for assistance with ADLs?: Yes Does the patient have difficulty dressing or bathing?: No Independently performs ADLs?: Yes (appropriate for developmental age) Does the patient have difficulty walking or climbing stairs?: No Weakness of Legs: None Weakness of Arms/Hands: None  Permission Sought/Granted                  Emotional Assessment              Admission diagnosis:  Hypokalemia [E87.6] Hypomagnesemia [E83.42] Pyelonephritis [N12] Acute pyelonephritis  [N10] Sepsis, due to unspecified organism, unspecified whether acute organ dysfunction present [facility]) [A41.9] Patient Active Problem List   Diagnosis Date Noted   Acute pyelonephritis 05/26/2022   Sepsis ([place]) 05/26/2022   Bacterial vaginosis 05/26/2022   Electrolyte disturbance 05/26/2022   Hyponatremia 05/26/2022   Hypokalemia 05/26/2022   Hypomagnesemia 05/26/2022   Chest pain 05/26/2022   Nausea vomiting and diarrhea 05/26/2022   Hypophosphatemia 05/26/2022   Iron deficiency anemia due to chronic blood loss 03/18/2021   IUD (intrauterine device) in place 12/25/2018   Vitamin D deficiency 10/11/2018   Iron deficiency anemia 10/22/2016   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 [address] [place] [address] Phone: [phone] Fax: [phone] #[id number] [address]ina [address]on, [place] AT [facility] [place] [address] Phone: [phone] Fax: [phone]     Social Determinants of Health (SDOH) Interventions    Readmission Risk Interventions  No data to display

## 2022-05-26 NOTE — Assessment & Plan Note (Addendum)
Pleuritic chest pain.  Likely inflammatory in nature  Chest x-ray negative.   CT angiogram negative for PE.    Patient is very low risk for ACS.  Initial troponin 7.  Aspirin 81 mg daily --> discontinued  As needed morphine --> discontinued, outpatient NSAID  Check A1c, FLP --> no concerns   Trend troponin --> flat/no concern  will get 2d echo to r/o pericarditis --> normal

## 2022-05-26 NOTE — Assessment & Plan Note (Addendum)
Multiple electrolytes disturbance POA Managed, resolved

## 2022-05-26 NOTE — Consult Note (Signed)
CODE SEPSIS - PHARMACY COMMUNICATION  **Broad Spectrum Antibiotics should be administered within 1 hour of Sepsis diagnosis**  Time Code Sepsis Called/Page Received: 1215  Antibiotics Ordered: rocephin, metronidazole  Time of 1st antibiotic administration: 1239  Additional action taken by pharmacy: none  If necessary, Name of Provider/Nurse Contacted: n/a    Margaret Dunn ,PharmD Clinical Pharmacist  05/26/2022  1:08 PM

## 2022-05-27 ENCOUNTER — Inpatient Hospital Stay (HOSPITAL_COMMUNITY)
Admit: 2022-05-27 | Discharge: 2022-05-27 | Disposition: A | Discharge status: Home / Self Care | Payer: BC Managed Care – PPO | Attending: Internal Medicine | Admitting: Internal Medicine

## 2022-05-27 DIAGNOSIS — N760 Acute vaginitis: Secondary | ICD-10-CM | POA: Diagnosis not present

## 2022-05-27 DIAGNOSIS — N10 Acute tubulo-interstitial nephritis: Secondary | ICD-10-CM | POA: Diagnosis not present

## 2022-05-27 DIAGNOSIS — R112 Nausea with vomiting, unspecified: Secondary | ICD-10-CM

## 2022-05-27 DIAGNOSIS — R079 Chest pain, unspecified: Secondary | ICD-10-CM

## 2022-05-27 DIAGNOSIS — R197 Diarrhea, unspecified: Secondary | ICD-10-CM

## 2022-05-27 DIAGNOSIS — A419 Sepsis, unspecified organism: Secondary | ICD-10-CM | POA: Diagnosis not present

## 2022-05-27 LAB — BASIC METABOLIC PANEL
Anion gap: 7 (ref 5–15)
Anion gap: 7 (ref 5–15)
BUN: <5 mg/dL — ABNORMAL LOW (ref 6–20)
BUN: <5 mg/dL — ABNORMAL LOW (ref 6–20)
CO2: 21 mmol/L — ABNORMAL LOW (ref 22–32)
CO2: 22 mmol/L (ref 22–32)
Calcium: 7.7 mg/dL — ABNORMAL LOW (ref 8.9–10.3)
Calcium: 8 mg/dL — ABNORMAL LOW (ref 8.9–10.3)
Chloride: 106 mmol/L (ref 98–111)
Chloride: 107 mmol/L (ref 98–111)
Creatinine, Ser: 0.66 mg/dL (ref 0.44–1.00)
Creatinine, Ser: 0.68 mg/dL (ref 0.44–1.00)
GFR, Estimated: >60 mL/min (ref >60)
GFR, Estimated: >60 mL/min (ref >60)
Glucose, Bld: 97 mg/dL (ref 70–99)
Glucose, Bld: 105 mg/dL — ABNORMAL HIGH (ref 70–99)
Potassium: 3.4 mmol/L — ABNORMAL LOW (ref 3.5–5.1)
Potassium: 3.8 mmol/L (ref 3.5–5.1)
Sodium: 134 mmol/L — ABNORMAL LOW (ref 135–145)
Sodium: 136 mmol/L (ref 135–145)

## 2022-05-27 LAB — CBC
HCT: 28.7 % — ABNORMAL LOW (ref 36.0–46.0)
Hemoglobin: 9.7 g/dL — ABNORMAL LOW (ref 12.0–15.0)
MCH: 30.8 pg (ref 26.0–34.0)
MCHC: 33.8 g/dL (ref 30.0–36.0)
MCV: 91.1 fL (ref 80.0–100.0)
Platelets: 219 10*3/uL (ref 150–400)
RBC: 3.15 MIL/uL — ABNORMAL LOW (ref 3.87–5.11)
RDW: 11.7 % (ref 11.5–15.5)
WBC: 12.5 10*3/uL — ABNORMAL HIGH (ref 4.0–10.5)
nRBC: 0 % (ref 0.0–0.2)

## 2022-05-27 LAB — ECHOCARDIOGRAM COMPLETE
AR max vel: 2.21 cm2
AV Area VTI: 2.11 cm2
AV Area mean vel: 2.14 cm2
AV Mean grad: 4 mmHg
AV Peak grad: 6.1 mmHg
Ao pk vel: 1.23 m/s
Area-P 1/2: 3.58 cm2
Height: 64 in
S' Lateral: 2.35 cm
Single Plane A4C EF: 70.1 %
Weight: 1776 oz

## 2022-05-27 LAB — PHOSPHORUS: Phosphorus: 1.5 mg/dL — ABNORMAL LOW (ref 2.5–4.6)

## 2022-05-27 LAB — MAGNESIUM: Magnesium: 1.7 mg/dL (ref 1.7–2.4)

## 2022-05-27 LAB — HIV ANTIBODY (ROUTINE TESTING W REFLEX): HIV Screen 4th Generation wRfx: NONREACTIVE

## 2022-05-27 MED ORDER — OXYCODONE HCL 5 MG PO TABS
5.0000 mg | ORAL_TABLET | ORAL | Status: DC | PRN
  Administered 2022-05-27 (×2): 5 mg via ORAL
  Filled 2022-05-27 (×2): qty 1

## 2022-05-27 MED ORDER — MAGNESIUM SULFATE 2 GM/50ML IV SOLN
2.0000 g | Freq: Once | INTRAVENOUS | Status: AC
  Administered 2022-05-27: 2 g via INTRAVENOUS
  Filled 2022-05-27: qty 50

## 2022-05-27 MED ORDER — MORPHINE SULFATE (PF) 2 MG/ML IV SOLN
1.0000 mg | INTRAVENOUS | Status: DC | PRN
  Administered 2022-05-28: 1 mg via INTRAVENOUS
  Filled 2022-05-27: qty 1

## 2022-05-27 MED ORDER — POTASSIUM PHOSPHATES 15 MMOLE/5ML IV SOLN
30.0000 mmol | Freq: Once | INTRAVENOUS | Status: AC
  Administered 2022-05-27: 30 mmol via INTRAVENOUS
  Filled 2022-05-27: qty 10

## 2022-05-27 NOTE — Progress Notes (Signed)
PROGRESS NOTE    Margaret Dunn   LKT:[id number] DOB: 07/24/2001  DOA: 05/26/2022 Date of Service: 05/27/22 PCP: Delsa Grana, PA-C     Brief Narrative / Hospital Course:  Margaret Dunn is a 21 y.o. 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 08/09: Temperature max 102.2, tachycardia 116, WBC 19.4, abnormal UA, positive clue cells on wet prep, hypokalemia 2.7, hyponatremia 129, hypophosphatemia, hypomagnesemia.  CXR negative, CT head negative, CT abdomen/pelvis suggestive of pyelonephritis.  GC/CT negative mono, negative group A strep, negative COVID/flu.  Admitted to hospitalist service with sepsis due to pyelonephritis/UTI possibly viral gastroenteritis, chest pain.  Troponins negative, CT angio negative for PE, echocardiogram ordered to evaluate for pericarditis.  08/10: Still meeting sepsis criteria with fever, tachycardia, leukocytosis.  Sodium and potassium initially improved but slightly low on recheck.  Blood and urine cultures pending.  Echocardiogram performed, read is pending    Consultants:  none  Procedures: none    Subjective: Patient reports persistent fever/chills and sweats, appetite is a bit better today, mild headache.  Occasional loose stool but no significant diarrhea compared to prior to admission.     ASSESSMENT & PLAN:   Principal Problem:   Sepsis ([place]) Active Problems:   Acute pyelonephritis   Bacterial vaginosis   Chest pain   Nausea vomiting and diarrhea   Electrolyte disturbance   Hyponatremia   Hypokalemia   Hypomagnesemia   Hypophosphatemia   Sepsis ([place]) Sepsis due to acute pyelonephritis and bacterial vaginosis:  POA w/ WBC 19.4, heart rate 116.  Lactic acid 1.1 --> 1.1. Admitted to MedSurg bed as inpatient IV Rocephin for pyelonephritis IV  Flagyl for bacterial vaginosis Follow-up blood culture and urine culture IV fluid: 1 L normal saline +1 L LR, then 125 cc/h of normal saline --> off when po intake improved and no longer meeting sepsis criteria procalcitonin level --> 17.82  Acute pyelonephritis See above  Bacterial vaginosis See above  Chest pain Pleuritic chest pain.  Likely inflammatory in nature Chest x-ray negative.  CT angiogram negative for PE.   Patient is very low risk for ACS.  Initial troponin 7. Aspirin 81 mg daily --> discontinued As needed morphine Check A1c, FLP Trend troponin --> flat/no concern will get 2d echo to r/o pericarditis  Nausea vomiting and diarrhea Likely viral gastroenteritis Follow-up C. difficile and GI pathogen panel As needed Zofran  Electrolyte disturbance Multiple electrolytes disturbance: Hypokalemia with potassium 2.7, hypomagnesemia with magnesium 1.2, hyponatremia with sodium 129, hypophosphatemia with phosphorus <1.0 Repleted potassium, magnesium and phosphorus IV fluid for hyponatremia (see below) Pharmacy following for electrolyte management  Hyponatremia Sodium 129 on admission, improved with rehydration Serial BMP daily   Hypokalemia see above  Hypomagnesemia see above  Hypophosphatemia see above    DVT prophylaxis: lovenox Code Status: FULL Family Communication: Family at bedside on rounds Disposition Plan / TOC needs: None at this time Barriers to discharge / significant pending items: Remains inpatient on IV antibiotics pending urine and blood culture results and resolution of sepsis criteria             Objective: Vitals:   05/26/22 2005 05/27/22 0319 05/27/22 0500 05/27/22 0800  BP: 117/69 107/69  125/74  Pulse: 80 (!) 104  96  Resp: '16 16  18  '$ Temp: 98.2 F (36.8 C) (!) 102.2 F (39 C) 98.4 F (36.9 C) (!)  100.6 F (38.1 C)  TempSrc: Oral Oral Oral Oral  SpO2: 100% 100%  100%  Weight:      Height:        Intake/Output  Summary (Last 24 hours) at 05/27/2022 1419 Last data filed at 05/27/2022 0400 Gross per 24 hour  Intake 2984.35 ml  Output --  Net 2984.35 ml   Filed Weights   05/26/22 0842  Weight: 50.3 kg    Examination:  Constitutional:  VS as above General Appearance: alert, well-developed, well-nourished, NAD Eyes: Normal lids and conjunctive, non-icteric sclera Ears, Nose, Mouth, Throat: Normal external appearance MMM Neck: No masses, trachea midline Respiratory: Normal respiratory effort No wheeze No rhonchi No rales Cardiovascular: S1/S2 normal No lower extremity edema Gastrointestinal: No tenderness No masses No hernia appreciated Musculoskeletal:  No clubbing/cyanosis of digits Symmetrical movement in all extremities Neurological: No cranial nerve deficit on limited exam Alert Psychiatric: Normal judgment/insight Normal mood and affect       Scheduled Medications:   aspirin EC  81 mg Oral Daily   enoxaparin (LOVENOX) injection  40 mg Subcutaneous Q24H    Continuous Infusions:  sodium chloride 125 mL/hr at 05/27/22 0834   cefTRIAXone (ROCEPHIN)  IV 2 g (05/27/22 1224)   metronidazole 500 mg (05/27/22 1359)    PRN Medications:  acetaminophen, ibuprofen, morphine injection, ondansetron (ZOFRAN) IV, oxyCODONE  Antimicrobials:  Anti-infectives (From admission, onward)    Start     Dose/Rate Route Frequency Ordered Stop   05/26/22 1215  metroNIDAZOLE (FLAGYL) IVPB 500 mg        500 mg 100 mL/hr over 60 Minutes Intravenous Every 12 hours 05/26/22 1211     05/26/22 1145  cefTRIAXone (ROCEPHIN) 2 g in sodium chloride 0.9 % 100 mL IVPB        2 g 200 mL/hr over 30 Minutes Intravenous Every 24 hours 05/26/22 1136 06/02/22 1144   05/26/22 1145  metroNIDAZOLE (FLAGYL) IVPB 500 mg  Status:  Discontinued        500 mg 100 mL/hr over 60 Minutes Intravenous  Once 05/26/22 1136 05/26/22 1209       Data Reviewed: I have personally reviewed following labs and  imaging studies  CBC: Recent Labs  Lab 05/26/22 0844 05/27/22 0444  WBC 19.4* 12.5*  HGB 12.4 9.7*  HCT 37.4 28.7*  MCV 90.6 91.1  PLT 267 389   Basic Metabolic Panel: Recent Labs  Lab 05/26/22 0844 05/26/22 0907 05/26/22 1344 05/26/22 1954 05/27/22 0444 05/27/22 1217  NA 129*  --  135 137 134* 136  K 2.7*  --  3.3* 3.9 3.4* 3.8  CL 94*  --  104 107 106 107  CO2 23  --  23 23 21* 22  GLUCOSE 126*  --  112* 106* 97 105*  BUN <5*  --  5* <5* <5* <5*  CREATININE 0.88  --  0.72 0.82 0.66 0.68  CALCIUM 8.9  --  8.0* 8.2* 7.7* 8.0*  MG  --  1.2*  --   --  1.7  --   PHOS <1.0*  --   --   --  1.5*  --    GFR: Estimated Creatinine Clearance: 86.8 mL/min (by C-G formula based on SCr of 0.68 mg/dL). Liver Function Tests: Recent Labs  Lab 05/26/22 0844  AST 21  ALT 13  ALKPHOS 49  BILITOT 1.1  PROT 7.6  ALBUMIN 3.9   Recent Labs  Lab 05/26/22 0844  LIPASE 24   No results for input(s): "  AMMONIA" in the last 168 hours. Coagulation Profile: Recent Labs  Lab 05/26/22 1344  INR 1.2   Cardiac Enzymes: No results for input(s): "CKTOTAL", "CKMB", "CKMBINDEX", "TROPONINI" in the last 168 hours. BNP (last 3 results) No results for input(s): "PROBNP" in the last 8760 hours. HbA1C: Recent Labs    05/26/22 1344  HGBA1C 4.6*   CBG: No results for input(s): "GLUCAP" in the last 168 hours. Lipid Profile: Recent Labs    05/26/22 1344  CHOL 113  HDL 62  LDLCALC 45  TRIG 30  CHOLHDL 1.8   Thyroid Function Tests: No results for input(s): "TSH", "T4TOTAL", "FREET4", "T3FREE", "THYROIDAB" in the last 72 hours. Anemia Panel: No results for input(s): "VITAMINB12", "FOLATE", "FERRITIN", "TIBC", "IRON", "RETICCTPCT" in the last 72 hours. Urine analysis:    Component Value Date/Time   COLORURINE YELLOW (A) 05/26/2022 0953   APPEARANCEUR HAZY (A) 05/26/2022 0953   LABSPEC 1.002 (L) 05/26/2022 0953   PHURINE 7.0 05/26/2022 0953   GLUCOSEU NEGATIVE 05/26/2022 0953    HGBUR MODERATE (A) 05/26/2022 0953   BILIRUBINUR NEGATIVE 05/26/2022 0953   KETONESUR 5 (A) 05/26/2022 0953   PROTEINUR 30 (A) 05/26/2022 0953   NITRITE NEGATIVE 05/26/2022 0953   LEUKOCYTESUR LARGE (A) 05/26/2022 0953   Sepsis Labs: '@LABRCNTIP'$ (procalcitonin:4,lacticidven:4)  Recent Results (from the past 240 hour(s))  Resp Panel by RT-PCR (Flu A&B, Covid)     Status: None   Collection Time: 05/26/22  9:07 AM   Specimen: Nasal Swab  Result Value Ref Range Status   SARS Coronavirus 2 by RT PCR NEGATIVE NEGATIVE Final    Comment: (NOTE) SARS-CoV-2 target nucleic acids are NOT DETECTED.  The SARS-CoV-2 RNA is generally detectable in upper respiratory specimens during the acute phase of infection. The lowest concentration of SARS-CoV-2 viral copies this assay can detect is 138 copies/mL. A negative result does not preclude SARS-Cov-2 infection and should not be used as the sole basis for treatment or other patient management decisions. A negative result may occur with  improper specimen collection/handling, submission of specimen other than nasopharyngeal swab, presence of viral mutation(s) within the areas targeted by this assay, and inadequate number of viral copies(<138 copies/mL). A negative result must be combined with clinical observations, patient history, and epidemiological information. The expected result is Negative.  Fact Sheet for Patients:  EntrepreneurPulse.com.au  Fact Sheet for Healthcare Providers:  IncredibleEmployment.be  This test is no t yet approved or cleared by the [place] FDA and  has been authorized for detection and/or diagnosis of SARS-CoV-2 by FDA under an Emergency Use Authorization (EUA). This EUA will remain  in effect (meaning this test can be used) for the duration of the COVID-19 declaration under Section 564(b)(1) of the Act, 21 U.S.C.section 360bbb-3(b)(1), unless the authorization is terminated  or  revoked sooner.       Influenza A by PCR NEGATIVE NEGATIVE Final   Influenza B by PCR NEGATIVE NEGATIVE Final    Comment: (NOTE) The Xpert Xpress SARS-CoV-2/FLU/RSV plus assay is intended as an aid in the diagnosis of influenza from Nasopharyngeal swab specimens and should not be used as a sole basis for treatment. Nasal washings and aspirates are unacceptable for Xpert Xpress SARS-CoV-2/FLU/RSV testing.  Fact Sheet for Patients: EntrepreneurPulse.com.au  Fact Sheet for Healthcare Providers: IncredibleEmployment.be  This test is not yet approved or cleared by the [place] FDA and has been authorized for detection and/or diagnosis of SARS-CoV-2 by FDA under an Emergency Use Authorization (EUA). This EUA will remain in effect (  meaning this test can be used) for the duration of the COVID-19 declaration under Section 564(b)(1) of the Act, 21 U.S.C. section 360bbb-3(b)(1), unless the authorization is terminated or revoked.  Performed at [facility], [address] [place], [place] [address]   [place] rt [facility] only)     Status: None   Collection Time: 05/26/22  9:53 AM  Result Value Ref Range Status   Specimen source GC/Chlam ENDOCERVICAL  Final   Chlamydia Tr NOT DETECTED NOT DETECTED Final   N gonorrhoeae NOT DETECTED NOT DETECTED Final    Comment: (NOTE) This CT/NG assay has not been evaluated in patients with a history of  hysterectomy. Performed at [facility], [address] [place], [place] [address]   Wet prep, genital     Status: Abnormal   Collection Time: 05/26/22  9:53 AM  Result Value Ref Range Status   Yeast Wet Prep HPF POC NONE SEEN NONE SEEN Final   Trich, Wet Prep NONE SEEN NONE SEEN Final   Clue Cells Wet Prep HPF POC PRESENT (A) NONE SEEN Final   WBC, Wet Prep HPF POC >=10 (A) <10 Final   Sperm NONE SEEN  Final    Comment: Performed at [facility], [address],  [place], [place] [address]  Urine Culture     Status: Abnormal (Preliminary result)   Collection Time: 05/26/22  9:53 AM   Specimen: Urine, Clean Catch  Result Value Ref Range Status   Specimen Description   Final    URINE, CLEAN CATCH Performed at [facility], [address] [place], [place] [address]    Special Requests   Final    NONE Performed at [facility], [address] [place], [place] [address]    Culture (A)  Final    50,000 COLONIES/mL ESCHERICHIA COLI SUSCEPTIBILITIES TO FOLLOW Performed at [facility], [place] [address], [place], [place] [address]    Report Status PENDING  Incomplete  Blood culture (routine x 2)     Status: None (Preliminary result)   Collection Time: 05/26/22 11:29 AM   Specimen: BLOOD LEFT ARM  Result Value Ref Range Status   Specimen Description BLOOD LEFT ARM  Final   Special Requests   Final    BOTTLES DRAWN AEROBIC AND ANAEROBIC Blood Culture adequate volume   Culture   Final    NO GROWTH < 24 HOURS Performed at [facility], [address], [place], [place] [address]    Report Status PENDING  Incomplete  Blood culture (routine x 2)     Status: None (Preliminary result)   Collection Time: 05/26/22 11:29 AM   Specimen: BLOOD RIGHT ARM  Result Value Ref Range Status   Specimen Description BLOOD RIGHT ARM  Final   Special Requests   Final    BOTTLES DRAWN AEROBIC AND ANAEROBIC Blood Culture results may not be optimal due to an excessive volume of blood received in culture bottles   Culture   Final    NO GROWTH < 24 HOURS Performed at [facility], [address] [place], [place] [address]    Report Status PENDING  Incomplete         Radiology Studies: No results found.          LOS: 1 day       Emeterio Reeve, DO [facility] 05/27/2022, 2:19 PM   Staff may message me via secure chat in [place]  but this may not  receive immediate response,  please page for urgent  matters!  If 7PM-7AM, please contact night-coverage [email or web address]  Dictation software was used to generate the above note. Typos may occur and escape review, as with typed/written notes. Please contact Dr Sheppard Coil directly for clarity if needed.

## 2022-05-27 NOTE — Hospital Course (Addendum)
Margaret Dunn is a 21 y.o. 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 08/09: Temperature max 102.2, tachycardia 116, WBC 19.4, abnormal UA, positive clue cells on wet prep, hypokalemia 2.7, hyponatremia 129, hypophosphatemia, hypomagnesemia.  CXR negative, CT head negative, CT abdomen/pelvis suggestive of pyelonephritis.  GC/CT negative mono, negative group A strep, negative COVID/flu.  Admitted to hospitalist service with sepsis due to pyelonephritis/UTI possibly viral gastroenteritis, chest pain.  Troponins negative, CT angio negative for PE, echocardiogram ordered to evaluate for pericarditis.  08/10: Still meeting sepsis criteria with fever, tachycardia, leukocytosis.  Sodium and potassium initially improved but slightly low on recheck.  Blood and urine cultures pending.  Echocardiogram EF 60 to 65%, normal LV function diastolic parameters, no evidence of pericardial abnormality. 08/11: Blood cultures no growth x 2 days.  Urine culture positive E. coli, sensitive to ciprofloxacin.  Temperature 99.8, white blood cells down from 12.5-8.6, other VS normal, no longer meeting sepsis/SIRS criteria however diarrhea and unable to tolerate sufficient po intake - added back IV fluids, trial on po abx and nausea control, hopefully improve overnight and can d/c tomorrow if maintaining po intake/hydration. GI pcr negative, possibly cleared infection vs irritable bowel vs medication effect and hopefully should improve - ordered imodium  08/12: VSS, appetite improved, tolerating liquids and bland food and fruits. Feels stable for discharge home w/ rest and ER precautions. Work note provided.

## 2022-05-27 NOTE — Progress Notes (Signed)
*  PRELIMINARY RESULTS* Echocardiogram 2D Echocardiogram has been performed.  Margaret Dunn 05/27/2022, 10:21 AM

## 2022-05-27 NOTE — Plan of Care (Signed)
  Problem: Clinical Measurements: Goal: Will remain free from infection Outcome: Progressing   Problem: Clinical Measurements: Goal: Diagnostic test results will improve Outcome: Progressing   Problem: Clinical Measurements: Goal: Respiratory complications will improve Outcome: Progressing   

## 2022-05-28 DIAGNOSIS — R079 Chest pain, unspecified: Secondary | ICD-10-CM | POA: Diagnosis not present

## 2022-05-28 DIAGNOSIS — A419 Sepsis, unspecified organism: Secondary | ICD-10-CM | POA: Diagnosis not present

## 2022-05-28 DIAGNOSIS — N760 Acute vaginitis: Secondary | ICD-10-CM | POA: Diagnosis not present

## 2022-05-28 DIAGNOSIS — N10 Acute tubulo-interstitial nephritis: Secondary | ICD-10-CM | POA: Diagnosis not present

## 2022-05-28 LAB — CBC
HCT: 30.8 % — ABNORMAL LOW (ref 36.0–46.0)
Hemoglobin: 10.2 g/dL — ABNORMAL LOW (ref 12.0–15.0)
MCH: 30.8 pg (ref 26.0–34.0)
MCHC: 33.1 g/dL (ref 30.0–36.0)
MCV: 93.1 fL (ref 80.0–100.0)
Platelets: 222 10*3/uL (ref 150–400)
RBC: 3.31 MIL/uL — ABNORMAL LOW (ref 3.87–5.11)
RDW: 11.8 % (ref 11.5–15.5)
WBC: 8.6 10*3/uL (ref 4.0–10.5)
nRBC: 0 % (ref 0.0–0.2)

## 2022-05-28 LAB — GASTROINTESTINAL PANEL BY PCR, STOOL (REPLACES STOOL CULTURE)

## 2022-05-28 LAB — C DIFFICILE QUICK SCREEN W PCR REFLEX
C Diff antigen: NEGATIVE
C Diff interpretation: NOT DETECTED
C Diff toxin: NEGATIVE

## 2022-05-28 LAB — PHOSPHORUS: Phosphorus: 3.4 mg/dL (ref 2.5–4.6)

## 2022-05-28 LAB — MAGNESIUM: Magnesium: 1.9 mg/dL (ref 1.7–2.4)

## 2022-05-28 LAB — URINE CULTURE: Culture: 50000 — AB

## 2022-05-28 LAB — BASIC METABOLIC PANEL
Anion gap: 6 (ref 5–15)
BUN: <5 mg/dL — ABNORMAL LOW (ref 6–20)
CO2: 25 mmol/L (ref 22–32)
Calcium: 8.1 mg/dL — ABNORMAL LOW (ref 8.9–10.3)
Chloride: 106 mmol/L (ref 98–111)
Creatinine, Ser: 0.65 mg/dL (ref 0.44–1.00)
GFR, Estimated: >60 mL/min (ref >60)
Glucose, Bld: 94 mg/dL (ref 70–99)
Potassium: 3.6 mmol/L (ref 3.5–5.1)
Sodium: 137 mmol/L (ref 135–145)

## 2022-05-28 MED ORDER — CIPROFLOXACIN HCL 500 MG PO TABS
500.0000 mg | ORAL_TABLET | Freq: Two times a day (BID) | ORAL | Status: DC
  Administered 2022-05-28 – 2022-05-29 (×2): 500 mg via ORAL
  Filled 2022-05-28 (×2): qty 1

## 2022-05-28 MED ORDER — LOPERAMIDE HCL 2 MG PO CAPS
2.0000 mg | ORAL_CAPSULE | ORAL | Status: DC | PRN
  Administered 2022-05-28: 2 mg via ORAL
  Filled 2022-05-28: qty 1

## 2022-05-28 MED ORDER — LACTATED RINGERS IV SOLN
INTRAVENOUS | Status: DC
Start: 2022-05-28 — End: 2022-05-29

## 2022-05-28 MED ORDER — CIPROFLOXACIN HCL 500 MG PO TABS
500.0000 mg | ORAL_TABLET | Freq: Once | ORAL | Status: AC
Start: 2022-05-28 — End: 2022-05-28
  Administered 2022-05-28: 500 mg via ORAL
  Filled 2022-05-28: qty 1

## 2022-05-28 MED ORDER — METRONIDAZOLE 500 MG PO TABS
500.0000 mg | ORAL_TABLET | Freq: Once | ORAL | Status: AC
  Administered 2022-05-28: 500 mg via ORAL
  Filled 2022-05-28: qty 1

## 2022-05-28 MED ORDER — ONDANSETRON 4 MG PO TBDP
8.0000 mg | ORAL_TABLET | Freq: Three times a day (TID) | ORAL | Status: DC | PRN
  Administered 2022-05-28: 8 mg via ORAL
  Filled 2022-05-28: qty 2

## 2022-05-28 MED ORDER — METRONIDAZOLE 500 MG PO TABS
500.0000 mg | ORAL_TABLET | Freq: Two times a day (BID) | ORAL | Status: DC
  Administered 2022-05-28 – 2022-05-29 (×2): 500 mg via ORAL
  Filled 2022-05-28 (×2): qty 1

## 2022-05-28 NOTE — Plan of Care (Signed)
  Problem: Pain Managment: Goal: General experience of comfort will improve Outcome: Not Progressing   Problem: Safety: Goal: Ability to remain free from injury will improve Outcome: Not Progressing   Problem: Nutrition: Goal: Adequate nutrition will be maintained Outcome: Not Progressing

## 2022-05-28 NOTE — Progress Notes (Signed)
PROGRESS NOTE    Margaret Dunn   DUK:[id number] DOB: 2000/11/08  DOA: 05/26/2022 Date of Service: 05/28/22 PCP: Delsa Grana, PA-C     Brief Narrative / Hospital Course:  Margaret Dunn is a 21 y.o. 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 08/09: Temperature max 102.2, tachycardia 116, WBC 19.4, abnormal UA, positive clue cells on wet prep, hypokalemia 2.7, hyponatremia 129, hypophosphatemia, hypomagnesemia.  CXR negative, CT head negative, CT abdomen/pelvis suggestive of pyelonephritis.  GC/CT negative mono, negative group A strep, negative COVID/flu.  Admitted to hospitalist service with sepsis due to pyelonephritis/UTI possibly viral gastroenteritis, chest pain.  Troponins negative, CT angio negative for PE, echocardiogram ordered to evaluate for pericarditis.  08/10: Still meeting sepsis criteria with fever, tachycardia, leukocytosis.  Sodium and potassium initially improved but slightly low on recheck.  Blood and urine cultures pending.  Echocardiogram EF 60 to 65%, normal LV function diastolic parameters, no evidence of pericardial abnormality. 08/11: Blood cultures no growth x 2 days.  Urine culture positive E. coli, sensitive to ciprofloxacin.  Temperature 99.8, white blood cells down from 12.5-8.6, other VS normal, no longer meeting sepsis/SIRS criteria however diarrhea and unable to tolerate sufficient po intake - added back IV fluids, trial on po abx and nausea control, hopefully improve overnight and can d/c tomorrow if maintaining po intake/hydration. GI pcr negative, possibly cleared infection vs irritable bowel vs medication effect and hopefully should improve - ordered imodium     Consultants:  none  Procedures: none    Subjective: Patient reports persistent fever/chills and  sweats, unable to tolerate more than a few bites food, not drinking much, mild headache.  Occasional loose stool persists.      ASSESSMENT & PLAN:   Principal Problem:   Sepsis ([place]) Active Problems:   Acute pyelonephritis   Bacterial vaginosis   Chest pain   Nausea vomiting and diarrhea   Electrolyte disturbance   Hyponatremia   Hypokalemia   Hypomagnesemia   Hypophosphatemia   Sepsis ([place]) Sepsis due to acute pyelonephritis and bacterial vaginosis:  POA w/ WBC 19.4, heart rate 116.  Lactic acid 1.1 --> 1.1. 08/11: Blood cultures no growth x 2 days.  Urine culture positive E. coli, sensitive to ciprofloxacin.  Temperature 99.8, white blood cells down from 12.5-8.6, other VS normal, no longer meeting sepsis/SIRS criteria Admitted to [facility] bed as inpatient IV Rocephin for pyelonephritis --> po ciprofloxacin IV Flagyl for bacterial vaginosis --> po flagyl on discharge   Acute pyelonephritis See above  Bacterial vaginosis See above  Chest pain Pleuritic chest pain.  Likely inflammatory in nature Chest x-ray negative.  CT angiogram negative for PE.   Patient is very low risk for ACS.  Initial troponin 7. Aspirin 81 mg daily --> discontinued As needed morphine --> discontinued, outpatient NSAID Check A1c, FLP --> no concerns  Trend troponin --> flat/no concern will get 2d echo to r/o pericarditis --> normal   Nausea vomiting and diarrhea Likely viral gastroenteritis vs abx effect vs irritability  Follow-up C. difficile and GI pathogen panel --> negative As needed Zofran Imodium ordered  Electrolyte disturbance Multiple electrolytes disturbance POA Managed, resolved  Hyponatremia Sodium 129 on admission, improved with rehydration Serial BMP daily   Hypokalemia see above  Hypomagnesemia see above  Hypophosphatemia see above    DVT prophylaxis: lovenox Code Status: FULL Family Communication: Family at  bedside on rounds Disposition Plan / TOC needs:  None at this time Barriers to discharge / significant pending items: Remains inpatient on IV fluids d/t poor po intake, hopeful for d/c tomorrow if this improves w/ symptomatic treatment              Objective: Vitals:   05/27/22 2115 05/28/22 0440 05/28/22 0800 05/28/22 1152  BP: (!) 132/92 (!) 135/90 120/76   Pulse: 85 99 85   Resp: '16 17 18   '$ Temp: 98.1 F (36.7 C)  99.8 F (37.7 C) 99.9 F (37.7 C)  TempSrc: Oral  Oral Oral  SpO2: 100% 100% 100%   Weight:      Height:        Intake/Output Summary (Last 24 hours) at 05/28/2022 1506 Last data filed at 05/27/2022 2007 Gross per 24 hour  Intake 237 ml  Output --  Net 237 ml   Filed Weights   05/26/22 0842  Weight: 50.3 kg    Examination:  Constitutional:  VS as above General Appearance: alert, well-developed, well-nourished, NAD Eyes: Normal lids and conjunctive, non-icteric sclera Ears, Nose, Mouth, Throat: Normal external appearance MMM Neck: No masses, trachea midline Respiratory: Normal respiratory effort No wheeze No rhonchi No rales Cardiovascular: S1/S2 normal No lower extremity edema Gastrointestinal: No tenderness No masses No hernia appreciated Musculoskeletal:  No clubbing/cyanosis of digits Symmetrical movement in all extremities Neurological: No cranial nerve deficit on limited exam Alert Psychiatric: Normal judgment/insight Normal mood and affect       Scheduled Medications:   aspirin EC  81 mg Oral Daily   ciprofloxacin  500 mg Oral BID   enoxaparin (LOVENOX) injection  40 mg Subcutaneous Q24H   metroNIDAZOLE  500 mg Oral Q12H    Continuous Infusions:  lactated ringers       PRN Medications:  acetaminophen, ibuprofen, loperamide, morphine injection, ondansetron (ZOFRAN) IV, ondansetron, oxyCODONE  Antimicrobials:  Anti-infectives (From admission, onward)    Start     Dose/Rate Route Frequency Ordered Stop   05/28/22 2200  metroNIDAZOLE (FLAGYL) tablet 500  mg        500 mg Oral Every 12 hours 05/28/22 1252 06/02/22 0959   05/28/22 2000  ciprofloxacin (CIPRO) tablet 500 mg        500 mg Oral 2 times daily 05/28/22 1252 06/02/22 0759   05/28/22 1015  ciprofloxacin (CIPRO) tablet 500 mg        500 mg Oral Once 05/28/22 0919 05/28/22 1141   05/28/22 1015  metroNIDAZOLE (FLAGYL) tablet 500 mg        500 mg Oral Once 05/28/22 0919 05/28/22 1141   05/26/22 1215  metroNIDAZOLE (FLAGYL) IVPB 500 mg  Status:  Discontinued        500 mg 100 mL/hr over 60 Minutes Intravenous Every 12 hours 05/26/22 1211 05/28/22 0919   05/26/22 1145  cefTRIAXone (ROCEPHIN) 2 g in sodium chloride 0.9 % 100 mL IVPB  Status:  Discontinued        2 g 200 mL/hr over 30 Minutes Intravenous Every 24 hours 05/26/22 1136 05/28/22 0919   05/26/22 1145  metroNIDAZOLE (FLAGYL) IVPB 500 mg  Status:  Discontinued        500 mg 100 mL/hr over 60 Minutes Intravenous  Once 05/26/22 1136 05/26/22 1209       Data Reviewed: I have personally reviewed following labs and imaging studies  CBC: Recent Labs  Lab 05/26/22 0844 05/27/22 0444 05/28/22 0428  WBC 19.4* 12.5* 8.6  HGB  12.4 9.7* 10.2*  HCT 37.4 28.7* 30.8*  MCV 90.6 91.1 93.1  PLT 267 219 734   Basic Metabolic Panel: Recent Labs  Lab 05/26/22 0844 05/26/22 0907 05/26/22 1344 05/26/22 1954 05/27/22 0444 05/27/22 1217 05/28/22 0428  NA 129*  --  135 137 134* 136 137  K 2.7*  --  3.3* 3.9 3.4* 3.8 3.6  CL 94*  --  104 107 106 107 106  CO2 23  --  23 23 21* 22 25  GLUCOSE 126*  --  112* 106* 97 105* 94  BUN <5*  --  5* <5* <5* <5* <5*  CREATININE 0.88  --  0.72 0.82 0.66 0.68 0.65  CALCIUM 8.9  --  8.0* 8.2* 7.7* 8.0* 8.1*  MG  --  1.2*  --   --  1.7  --  1.9  PHOS <1.0*  --   --   --  1.5*  --  3.4   GFR: Estimated Creatinine Clearance: 86.8 mL/min (by C-G formula based on SCr of 0.65 mg/dL). Liver Function Tests: Recent Labs  Lab 05/26/22 0844  AST 21  ALT 13  ALKPHOS 49  BILITOT 1.1  PROT 7.6   ALBUMIN 3.9   Recent Labs  Lab 05/26/22 0844  LIPASE 24   No results for input(s): "AMMONIA" in the last 168 hours. Coagulation Profile: Recent Labs  Lab 05/26/22 1344  INR 1.2   Cardiac Enzymes: No results for input(s): "CKTOTAL", "CKMB", "CKMBINDEX", "TROPONINI" in the last 168 hours. BNP (last 3 results) No results for input(s): "PROBNP" in the last 8760 hours. HbA1C: Recent Labs    05/26/22 1344  HGBA1C 4.6*   CBG: No results for input(s): "GLUCAP" in the last 168 hours. Lipid Profile: Recent Labs    05/26/22 1344  CHOL 113  HDL 62  LDLCALC 45  TRIG 30  CHOLHDL 1.8   Thyroid Function Tests: No results for input(s): "TSH", "T4TOTAL", "FREET4", "T3FREE", "THYROIDAB" in the last 72 hours. Anemia Panel: No results for input(s): "VITAMINB12", "FOLATE", "FERRITIN", "TIBC", "IRON", "RETICCTPCT" in the last 72 hours. Urine analysis:    Component Value Date/Time   COLORURINE YELLOW (A) 05/26/2022 0953   APPEARANCEUR HAZY (A) 05/26/2022 0953   LABSPEC 1.002 (L) 05/26/2022 0953   PHURINE 7.0 05/26/2022 0953   GLUCOSEU NEGATIVE 05/26/2022 0953   HGBUR MODERATE (A) 05/26/2022 0953   BILIRUBINUR NEGATIVE 05/26/2022 0953   KETONESUR 5 (A) 05/26/2022 0953   PROTEINUR 30 (A) 05/26/2022 0953   NITRITE NEGATIVE 05/26/2022 0953   LEUKOCYTESUR LARGE (A) 05/26/2022 0953   Sepsis Labs: '@LABRCNTIP'$ (procalcitonin:4,lacticidven:4)  Recent Results (from the past 240 hour(s))  Resp Panel by RT-PCR (Flu A&B, Covid)     Status: None   Collection Time: 05/26/22  9:07 AM   Specimen: Nasal Swab  Result Value Ref Range Status   SARS Coronavirus 2 by RT PCR NEGATIVE NEGATIVE Final    Comment: (NOTE) SARS-CoV-2 target nucleic acids are NOT DETECTED.  The SARS-CoV-2 RNA is generally detectable in upper respiratory specimens during the acute phase of infection. The lowest concentration of SARS-CoV-2 viral copies this assay can detect is 138 copies/mL. A negative result does not  preclude SARS-Cov-2 infection and should not be used as the sole basis for treatment or other patient management decisions. A negative result may occur with  improper specimen collection/handling, submission of specimen other than nasopharyngeal swab, presence of viral mutation(s) within the areas targeted by this assay, and inadequate number of viral copies(<138 copies/mL). A negative  result must be combined with clinical observations, patient history, and epidemiological information. The expected result is Negative.  Fact Sheet for Patients:  EntrepreneurPulse.com.au  Fact Sheet for Healthcare Providers:  IncredibleEmployment.be  This test is no t yet approved or cleared by the [place] FDA and  has been authorized for detection and/or diagnosis of SARS-CoV-2 by FDA under an Emergency Use Authorization (EUA). This EUA will remain  in effect (meaning this test can be used) for the duration of the COVID-19 declaration under Section 564(b)(1) of the Act, 21 U.S.C.section 360bbb-3(b)(1), unless the authorization is terminated  or revoked sooner.       Influenza A by PCR NEGATIVE NEGATIVE Final   Influenza B by PCR NEGATIVE NEGATIVE Final    Comment: (NOTE) The Xpert Xpress SARS-CoV-2/FLU/RSV plus assay is intended as an aid in the diagnosis of influenza from Nasopharyngeal swab specimens and should not be used as a sole basis for treatment. Nasal washings and aspirates are unacceptable for Xpert Xpress SARS-CoV-2/FLU/RSV testing.  Fact Sheet for Patients: EntrepreneurPulse.com.au  Fact Sheet for Healthcare Providers: IncredibleEmployment.be  This test is not yet approved or cleared by the [place] FDA and has been authorized for detection and/or diagnosis of SARS-CoV-2 by FDA under an Emergency Use Authorization (EUA). This EUA will remain in effect (meaning this test can be used) for the  duration of the COVID-19 declaration under Section 564(b)(1) of the Act, 21 U.S.C. section 360bbb-3(b)(1), unless the authorization is terminated or revoked.  Performed at [facility], [address] [place], [place] [address]   Gastrointestinal Panel by PCR , Stool     Status: None   Collection Time: 05/26/22  9:25 AM   Specimen: Stool  Result Value Ref Range Status   Campylobacter species NOT DETECTED NOT DETECTED Final   Plesimonas shigelloides NOT DETECTED NOT DETECTED Final   Salmonella species NOT DETECTED NOT DETECTED Final   Yersinia enterocolitica NOT DETECTED NOT DETECTED Final   Vibrio species NOT DETECTED NOT DETECTED Final   Vibrio cholerae NOT DETECTED NOT DETECTED Final   Enteroaggregative E coli (EAEC) NOT DETECTED NOT DETECTED Final   Enteropathogenic E coli (EPEC) NOT DETECTED NOT DETECTED Final   Enterotoxigenic E coli (ETEC) NOT DETECTED NOT DETECTED Final   Shiga like toxin producing E coli (STEC) NOT DETECTED NOT DETECTED Final   Shigella/Enteroinvasive E coli (EIEC) NOT DETECTED NOT DETECTED Final   Cryptosporidium NOT DETECTED NOT DETECTED Final   Cyclospora cayetanensis NOT DETECTED NOT DETECTED Final   Entamoeba histolytica NOT DETECTED NOT DETECTED Final   Giardia lamblia NOT DETECTED NOT DETECTED Final   Adenovirus F40/41 NOT DETECTED NOT DETECTED Final   Astrovirus NOT DETECTED NOT DETECTED Final   Norovirus GI/GII NOT DETECTED NOT DETECTED Final   Rotavirus A NOT DETECTED NOT DETECTED Final   Sapovirus (I, II, IV, and V) NOT DETECTED NOT DETECTED Final    Comment: Performed at [facility], [address] [place], [place] [address]  [place] rt PCR [facility] only)     Status: None   Collection Time: 05/26/22  9:53 AM  Result Value Ref Range Status   Specimen source GC/Chlam ENDOCERVICAL  Final   Chlamydia Tr NOT DETECTED NOT DETECTED Final   N gonorrhoeae NOT DETECTED NOT DETECTED Final    Comment: (NOTE) This CT/NG assay  has not been evaluated in patients with a history of  hysterectomy. Performed at [facility], [address] [place], [place] [address]   Wet prep,  genital     Status: Abnormal   Collection Time: 05/26/22  9:53 AM  Result Value Ref Range Status   Yeast Wet Prep HPF POC NONE SEEN NONE SEEN Final   Trich, Wet Prep NONE SEEN NONE SEEN Final   Clue Cells Wet Prep HPF POC PRESENT (A) NONE SEEN Final   WBC, Wet Prep HPF POC >=10 (A) <10 Final   Sperm NONE SEEN  Final    Comment: Performed at [facility], [address], [place], [place] [address]  Urine Culture     Status: Abnormal   Collection Time: 05/26/22  9:53 AM   Specimen: Urine, Clean Catch  Result Value Ref Range Status   Specimen Description   Final    URINE, CLEAN CATCH Performed at [facility], [address] [place], [place] [address]    Special Requests   Final    NONE Performed at [facility], [address] [place], [place] [address]    Culture 50,000 COLONIES/mL ESCHERICHIA COLI (A)  Final   Report Status 05/28/2022 FINAL  Final   Organism ID, Bacteria ESCHERICHIA COLI (A)  Final      Susceptibility   Escherichia coli - MIC*    AMPICILLIN >=32 RESISTANT Resistant     CEFAZOLIN <=4 SENSITIVE Sensitive     CEFEPIME <=0.12 SENSITIVE Sensitive     CEFTRIAXONE <=0.25 SENSITIVE Sensitive     CIPROFLOXACIN <=0.25 SENSITIVE Sensitive     GENTAMICIN <=1 SENSITIVE Sensitive     IMIPENEM <=0.25 SENSITIVE Sensitive     NITROFURANTOIN <=16 SENSITIVE Sensitive     TRIMETH/SULFA <=20 SENSITIVE Sensitive     AMPICILLIN/SULBACTAM >=32 RESISTANT Resistant     PIP/TAZO <=4 SENSITIVE Sensitive     * 50,000 COLONIES/mL ESCHERICHIA COLI  Blood culture (routine x 2)     Status: None (Preliminary result)   Collection Time: 05/26/22 11:29 AM   Specimen: BLOOD LEFT ARM  Result Value Ref Range Status   Specimen Description BLOOD LEFT ARM  Final   Special Requests   Final    BOTTLES DRAWN  AEROBIC AND ANAEROBIC Blood Culture adequate volume   Culture   Final    NO GROWTH 2 DAYS Performed at [facility], [address] [place], [place] [address]    Report Status PENDING  Incomplete  Blood culture (routine x 2)     Status: None (Preliminary result)   Collection Time: 05/26/22 11:29 AM   Specimen: BLOOD RIGHT ARM  Result Value Ref Range Status   Specimen Description BLOOD RIGHT ARM  Final   Special Requests   Final    BOTTLES DRAWN AEROBIC AND ANAEROBIC Blood Culture results may not be optimal due to an excessive volume of blood received in culture bottles   Culture   Final    NO GROWTH 2 DAYS Performed at [facility], [address] [place], [place] [address]    Report Status PENDING  Incomplete  C Difficile Quick Screen w PCR reflex     Status: None   Collection Time: 05/26/22  1:43 PM   Specimen: STOOL  Result Value Ref Range Status   C Diff antigen NEGATIVE NEGATIVE Final   C Diff toxin NEGATIVE NEGATIVE Final   C Diff interpretation No C. difficile detected.  Final    Comment: Performed at [facility], [address], [place], [place] [address]         Radiology Studies: No results found.  LOS: 2 days       Emeterio Reeve, DO [facility] 05/28/2022, 3:06 PM   Staff may message me via secure chat in [place]  but this may not receive immediate response,  please page for urgent matters!  If 7PM-7AM, please contact night-coverage [email or web address]  Dictation software was used to generate the above note. Typos may occur and escape review, as with typed/written notes. Please contact Dr Sheppard Coil directly for clarity if needed.

## 2022-05-28 NOTE — Progress Notes (Signed)
Mobility Specialist - Progress Note   05/28/22 1000  Mobility  Activity Ambulated independently in hallway  Level of Assistance Independent  Assistive Device None  Distance Ambulated (ft) 320 ft  Activity Response Tolerated well  $Mobility charge 1 Mobility     Pt ambulated 2 laps around NS voicing no complaints.  Merrily Brittle Mobility Specialist 05/28/22, 10:39 AM

## 2022-05-28 NOTE — Progress Notes (Signed)
End of shift note:  Pt's IVF were changed from NS to LR. Zofran was given for abdominal discomfort and imodium for diarrhea. C-diff came back negative and enteric precautions were DC.

## 2022-05-29 DIAGNOSIS — N10 Acute tubulo-interstitial nephritis: Secondary | ICD-10-CM | POA: Diagnosis not present

## 2022-05-29 DIAGNOSIS — N760 Acute vaginitis: Secondary | ICD-10-CM | POA: Diagnosis not present

## 2022-05-29 DIAGNOSIS — A419 Sepsis, unspecified organism: Secondary | ICD-10-CM | POA: Diagnosis not present

## 2022-05-29 DIAGNOSIS — R079 Chest pain, unspecified: Secondary | ICD-10-CM | POA: Diagnosis not present

## 2022-05-29 MED ORDER — ACETAMINOPHEN 325 MG PO TABS
650.0000 mg | ORAL_TABLET | Freq: Four times a day (QID) | ORAL | Status: DC | PRN
Start: 2022-05-29 — End: 2022-09-23

## 2022-05-29 MED ORDER — IBUPROFEN 400 MG PO TABS: 400.0000 mg | ORAL_TABLET | Freq: Three times a day (TID) | ORAL | 0 refills | Status: DC | PRN

## 2022-05-29 MED ORDER — METRONIDAZOLE 500 MG PO TABS: 500.0000 mg | ORAL_TABLET | Freq: Two times a day (BID) | ORAL | 0 refills | Status: AC

## 2022-05-29 MED ORDER — CIPROFLOXACIN HCL 500 MG PO TABS: 500.0000 mg | ORAL_TABLET | Freq: Two times a day (BID) | ORAL | 0 refills | Status: AC

## 2022-05-29 MED ORDER — ONDANSETRON 8 MG PO TBDP: 8.0000 mg | ORAL_TABLET | Freq: Three times a day (TID) | ORAL | 0 refills | Status: DC | PRN

## 2022-05-29 NOTE — Progress Notes (Signed)
Discharge order in place.  Discharge instructions given to patient and patient's mother.  Education emphasized on finishing antibiotic therapy and signs of infection to watch for.  Both verbalized understanding of instructions.  Needs addressed.  Discharged home.

## 2022-05-29 NOTE — Discharge Summary (Signed)
Physician Discharge Summary   Patient: Margaret Dunn MRN: [id number]  DOB: 07-24-2001   Admit:     Date of Admission: 05/26/2022 Admitted from: home   Discharge: Date of discharge: 05/29/22 Disposition: Home Condition at discharge: good  CODE STATUS: FULL     Discharge Physician: Emeterio Reeve, DO [facility]     PCP: Delsa Grana, PA-C  Recommendations for Outpatient Follow-up:  Follow up with PCP Delsa Grana, PA-C in 2 weeks Please obtain labs/tests: UA and Urine culture  in 2 weeks  Please follow up on the following pending results: none PCP AND OTHER OUTPATIENT PROVIDERS: SEE [facility] PATIENT INFO    Discharge Instructions     Diet - low sodium heart healthy   Complete by: As directed    Discharge instructions   Complete by: As directed    DRINK PLENTY OF FLUIDS AND GET REST. IF FEVER, SEVERE NAUSEA.AND/OR ABDOMINAL PAIN PLEASE COME BACK TO THE ER.   Increase activity slowly   Complete by: As directed           Hospital Course: Margaret Dunn is a 21 y.o. 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 08/09: Temperature max 102.2, tachycardia 116, WBC 19.4, abnormal UA, positive clue cells on wet prep, hypokalemia 2.7, hyponatremia 129, hypophosphatemia, hypomagnesemia.  CXR negative, CT head negative, CT abdomen/pelvis suggestive of pyelonephritis.  GC/CT negative mono, negative group A strep, negative COVID/flu.  Admitted to hospitalist service with sepsis due to pyelonephritis/UTI possibly viral gastroenteritis, chest pain.  Troponins negative, CT angio negative for PE, echocardiogram ordered to evaluate for pericarditis.  08/10: Still meeting sepsis criteria with fever,  tachycardia, leukocytosis.  Sodium and potassium initially improved but slightly low on recheck.  Blood and urine cultures pending.  Echocardiogram EF 60 to 65%, normal LV function diastolic parameters, no evidence of pericardial abnormality. 08/11: Blood cultures no growth x 2 days.  Urine culture positive E. coli, sensitive to ciprofloxacin.  Temperature 99.8, white blood cells down from 12.5-8.6, other VS normal, no longer meeting sepsis/SIRS criteria however diarrhea and unable to tolerate sufficient po intake - added back IV fluids, trial on po abx and nausea control, hopefully improve overnight and can d/c tomorrow if maintaining po intake/hydration. GI pcr negative, possibly cleared infection vs irritable bowel vs medication effect and hopefully should improve - ordered imodium  08/12: VSS, appetite improved, tolerating liquids and bland food and fruits. Feels stable for discharge home w/ rest and ER precautions. Work note provided.     Consultants:  none  Procedures:  none      Discharge Diagnoses: Principal Problem:   Sepsis ([place]) Active Problems:   Acute pyelonephritis   Bacterial vaginosis   Chest pain   Nausea vomiting and diarrhea   Electrolyte disturbance   Hyponatremia   Hypokalemia   Hypomagnesemia   Hypophosphatemia    Assessment & Plan:  Sepsis ([place]) Sepsis due to acute pyelonephritis and bacterial vaginosis SEPSIS RESOLVED POA w/ WBC 19.4, heart rate 116.  Lactic acid 1.1 --> 1.1. 08/11: Blood cultures no growth x 2 days.  Urine culture positive E. coli, sensitive to ciprofloxacin.  Temperature 99.8, white blood cells down from 12.5-8.6, other VS normal, no longer meeting sepsis/SIRS criteria Admitted to MedSurg bed as inpatient IV Rocephin for  pyelonephritis --> po ciprofloxacin IV Flagyl for bacterial vaginosis --> po flagyl on discharge   Acute pyelonephritis See above  Bacterial vaginosis See above  Chest pain Pleuritic chest pain.  Likely  inflammatory in nature Chest x-ray negative.  CT angiogram negative for PE.   Patient is very low risk for ACS.  Initial troponin 7. Aspirin 81 mg daily --> discontinued As needed morphine --> discontinued, outpatient NSAID Check A1c, FLP --> no concerns  Trend troponin --> flat/no concern will get 2d echo to r/o pericarditis --> normal   Nausea vomiting and diarrhea Likely viral gastroenteritis vs abx effect vs irritability  Follow-up C. difficile and GI pathogen panel --> negative As needed Zofran Imodium as needed   Electrolyte disturbance Multiple electrolytes disturbance POA Managed, resolved  Hyponatremia Sodium 129 on admission, improved with rehydration Serial BMP show improvement    Hypokalemia see above  Hypomagnesemia see above  Hypophosphatemia see above      Discharge Instructions  Allergies as of 05/29/2022   No Known Allergies      Medication List     STOP taking these medications    azelastine 0.1 % nasal spray Commonly known as: ASTELIN   dicyclomine 20 MG tablet Commonly known as: BENTYL   fluticasone 50 MCG/ACT nasal spray Commonly known as: FLONASE       TAKE these medications    acetaminophen 325 MG tablet Commonly known as: TYLENOL Take 2 tablets (650 mg total) by mouth every 6 (six) hours as needed for mild pain or fever.   ciprofloxacin 500 MG tablet Commonly known as: CIPRO Take 1 tablet (500 mg total) by mouth 2 (two) times daily for 4 days. Start first dose in the evening 05/29/2022   ibuprofen 400 MG tablet Commonly known as: ADVIL Take 1 tablet (400 mg total) by mouth every 8 (eight) hours as needed for headache.   metroNIDAZOLE 500 MG tablet Commonly known as: Flagyl Take 1 tablet (500 mg total) by mouth 2 (two) times daily for 4 days. Start first dose in the evening 05/29/2022   ondansetron 8 MG disintegrating tablet Commonly known as: ZOFRAN-ODT Take 1 tablet (8 mg total) by mouth every 8 (eight) hours as  needed for nausea.   PARAGARD INTRAUTERINE COPPER IU by Intrauterine route.          No Known Allergies   Subjective: still feeling tired and appetite is low but fever/chills have improved. Tolerating liquids and bland foods    Discharge Exam: BP (!) 143/98 (BP Location: Left Arm)   Pulse (!) 58   Temp 97.8 F (36.6 C) (Oral)   Resp 18   Ht '5\' 4"'$  (1.626 m)   Wt 50.3 kg   SpO2 98%   BMI 19.05 kg/m  General: Pt is alert, awake, not in acute distress Cardiovascular: RRR, S1/S2 +, no rubs, no gallops Respiratory: CTA bilaterally, no wheezing, no rhonchi Abdominal: Soft, NT, ND, bowel sounds + Extremities: no edema, no cyanosis     The results of significant diagnostics from this hospitalization (including imaging, microbiology, ancillary and laboratory) are listed below for reference.     Microbiology: Recent Results (from the past 240 hour(s))  Resp Panel by RT-PCR (Flu A&B, Covid)     Status: None   Collection Time: 05/26/22  9:07 AM   Specimen: Nasal Swab  Result Value Ref Range Status   SARS Coronavirus 2 by RT PCR NEGATIVE NEGATIVE Final    Comment: (NOTE) SARS-CoV-2 target nucleic acids are NOT DETECTED.  The SARS-CoV-2 RNA is generally detectable in upper respiratory specimens during the acute phase of infection. The lowest concentration of SARS-CoV-2 viral copies this assay can detect is 138 copies/mL. A negative result does not preclude SARS-Cov-2 infection and should not be used as the sole basis for treatment or other patient management decisions. A negative result may occur with  improper specimen collection/handling, submission of specimen other than nasopharyngeal swab, presence of viral mutation(s) within the areas targeted by this assay, and inadequate number of viral copies(<138 copies/mL). A negative result must be combined with clinical observations, patient history, and epidemiological information. The expected result is Negative.  Fact  Sheet for Patients:  EntrepreneurPulse.com.au  Fact Sheet for Healthcare Providers:  IncredibleEmployment.be  This test is no t yet approved or cleared by the [place] FDA and  has been authorized for detection and/or diagnosis of SARS-CoV-2 by FDA under an Emergency Use Authorization (EUA). This EUA will remain  in effect (meaning this test can be used) for the duration of the COVID-19 declaration under Section 564(b)(1) of the Act, 21 U.S.C.section 360bbb-3(b)(1), unless the authorization is terminated  or revoked sooner.       Influenza A by PCR NEGATIVE NEGATIVE Final   Influenza B by PCR NEGATIVE NEGATIVE Final    Comment: (NOTE) The Xpert Xpress SARS-CoV-2/FLU/RSV plus assay is intended as an aid in the diagnosis of influenza from Nasopharyngeal swab specimens and should not be used as a sole basis for treatment. Nasal washings and aspirates are unacceptable for Xpert Xpress SARS-CoV-2/FLU/RSV testing.  Fact Sheet for Patients: EntrepreneurPulse.com.au  Fact Sheet for Healthcare Providers: IncredibleEmployment.be  This test is not yet approved or cleared by the [place] FDA and has been authorized for detection and/or diagnosis of SARS-CoV-2 by FDA under an Emergency Use Authorization (EUA). This EUA will remain in effect (meaning this test can be used) for the duration of the COVID-19 declaration under Section 564(b)(1) of the Act, 21 U.S.C. section 360bbb-3(b)(1), unless the authorization is terminated or revoked.  Performed at [facility], [address] [place], [place] [address]   Gastrointestinal Panel by PCR , Stool     Status: None   Collection Time: 05/26/22  9:25 AM   Specimen: Stool  Result Value Ref Range Status   Campylobacter species NOT DETECTED NOT DETECTED Final   Plesimonas shigelloides NOT DETECTED NOT DETECTED Final   Salmonella species NOT DETECTED NOT  DETECTED Final   Yersinia enterocolitica NOT DETECTED NOT DETECTED Final   Vibrio species NOT DETECTED NOT DETECTED Final   Vibrio cholerae NOT DETECTED NOT DETECTED Final   Enteroaggregative E coli (EAEC) NOT DETECTED NOT DETECTED Final   Enteropathogenic E coli (EPEC) NOT DETECTED NOT DETECTED Final   Enterotoxigenic E coli (ETEC) NOT DETECTED NOT DETECTED Final   Shiga like toxin producing E coli (STEC) NOT DETECTED NOT DETECTED Final   Shigella/Enteroinvasive E coli (EIEC) NOT DETECTED NOT DETECTED Final   Cryptosporidium NOT DETECTED NOT DETECTED Final   Cyclospora cayetanensis NOT DETECTED NOT DETECTED Final   Entamoeba histolytica NOT DETECTED NOT DETECTED Final   Giardia lamblia NOT DETECTED NOT DETECTED Final   Adenovirus F40/41 NOT DETECTED NOT DETECTED Final   Astrovirus NOT DETECTED NOT DETECTED Final   Norovirus GI/GII NOT DETECTED NOT DETECTED Final   Rotavirus A NOT DETECTED NOT DETECTED Final   Sapovirus (I, II, IV, and V) NOT DETECTED NOT DETECTED Final    Comment: Performed at [facility], [address],  [place], [place] [address]  [place] rt PCR ([place] only)     Status: None   Collection Time: 05/26/22  9:53 AM  Result Value Ref Range Status   Specimen source GC/Chlam ENDOCERVICAL  Final   Chlamydia Tr NOT DETECTED NOT DETECTED Final   N gonorrhoeae NOT DETECTED NOT DETECTED Final    Comment: (NOTE) This CT/NG assay has not been evaluated in patients with a history of  hysterectomy. Performed at [facility], [address] [place], [place] [address]   Wet prep, genital     Status: Abnormal   Collection Time: 05/26/22  9:53 AM  Result Value Ref Range Status   Yeast Wet Prep HPF POC NONE SEEN NONE SEEN Final   Trich, Wet Prep NONE SEEN NONE SEEN Final   Clue Cells Wet Prep HPF POC PRESENT (A) NONE SEEN Final   WBC, Wet Prep HPF POC >=10 (A) <10 Final   Sperm NONE SEEN  Final    Comment: Performed at [facility], [address], [place], [place] [address]  Urine Culture     Status: Abnormal   Collection Time: 05/26/22  9:53 AM   Specimen: Urine, Clean Catch  Result Value Ref Range Status   Specimen Description   Final    URINE, CLEAN CATCH Performed at [facility], [address], [place], [place] [address]    Special Requests   Final    NONE Performed at [facility], [address] [place], [place] [address]    Culture 50,000 COLONIES/mL ESCHERICHIA COLI (A)  Final   Report Status 05/28/2022 FINAL  Final   Organism ID, Bacteria ESCHERICHIA COLI (A)  Final      Susceptibility   Escherichia coli - MIC*    AMPICILLIN >=32 RESISTANT Resistant     CEFAZOLIN <=4 SENSITIVE Sensitive     CEFEPIME <=0.12 SENSITIVE Sensitive     CEFTRIAXONE <=0.25 SENSITIVE Sensitive     CIPROFLOXACIN <=0.25 SENSITIVE Sensitive     GENTAMICIN <=1 SENSITIVE Sensitive     IMIPENEM <=0.25 SENSITIVE Sensitive     NITROFURANTOIN <=16 SENSITIVE Sensitive     TRIMETH/SULFA <=20 SENSITIVE Sensitive     AMPICILLIN/SULBACTAM >=32 RESISTANT Resistant     PIP/TAZO <=4 SENSITIVE Sensitive     * 50,000 COLONIES/mL ESCHERICHIA COLI  Blood culture (routine x 2)     Status: None (Preliminary result)   Collection Time: 05/26/22 11:29 AM   Specimen: BLOOD LEFT ARM  Result Value Ref Range Status   Specimen Description BLOOD LEFT ARM  Final   Special Requests   Final    BOTTLES DRAWN AEROBIC AND ANAEROBIC Blood Culture adequate volume   Culture   Final    NO GROWTH 3 DAYS Performed at [facility], [address], [place], [place] [address]    Report Status PENDING  Incomplete  Blood culture (routine x 2)     Status: None (Preliminary result)   Collection Time: 05/26/22 11:29 AM   Specimen: BLOOD RIGHT ARM  Result Value Ref Range Status   Specimen Description BLOOD RIGHT ARM  Final   Special Requests   Final    BOTTLES DRAWN AEROBIC AND ANAEROBIC Blood Culture results may not be optimal due to  an excessive volume of blood received in culture bottles   Culture   Final    NO GROWTH 3 DAYS Performed at [facility], [address], [place], [place] [address]    Report Status PENDING  Incomplete  C Difficile Quick Screen w PCR reflex     Status: None   Collection Time: 05/26/22  1:43 PM   Specimen: STOOL  Result Value Ref Range Status   C Diff antigen NEGATIVE NEGATIVE Final   C Diff toxin NEGATIVE NEGATIVE Final   C Diff interpretation No C. difficile detected.  Final    Comment: Performed at [facility], [address] [place], [place] [address]     Labs: BNP (last 3 results) No results for input(s): "BNP" in the last 8760 hours. Basic Metabolic Panel: Recent Labs  Lab 05/26/22 0844 05/26/22 0907 05/26/22 1344 05/26/22 1954 05/27/22 0444 05/27/22 1217 05/28/22 0428  NA 129*  --  135 137 134* 136 137  K 2.7*  --  3.3* 3.9 3.4* 3.8 3.6  CL 94*  --  104 107 106 107 106  CO2 23  --  23 23 21* 22 25  GLUCOSE 126*  --  112* 106* 97 105* 94  BUN <5*  --  5* <5* <5* <5* <5*  CREATININE 0.88  --  0.72 0.82 0.66 0.68 0.65  CALCIUM 8.9  --  8.0* 8.2* 7.7* 8.0* 8.1*  MG  --  1.2*  --   --  1.7  --  1.9  PHOS <1.0*  --   --   --  1.5*  --  3.4   Liver Function Tests: Recent Labs  Lab 05/26/22 0844  AST 21  ALT 13  ALKPHOS 49  BILITOT 1.1  PROT 7.6  ALBUMIN 3.9   Recent Labs  Lab 05/26/22 0844  LIPASE 24   No results for input(s): "AMMONIA" in the last 168 hours. CBC: Recent Labs  Lab 05/26/22 0844 05/27/22 0444 05/28/22 0428  WBC 19.4* 12.5* 8.6  HGB 12.4 9.7* 10.2*  HCT 37.4 28.7* 30.8*  MCV 90.6 91.1 93.1  PLT 267 219 222   Cardiac Enzymes: No results for input(s): "CKTOTAL", "CKMB", "CKMBINDEX", "TROPONINI" in the last 168 hours. BNP: Invalid input(s): "POCBNP" CBG: No results for input(s): "GLUCAP" in the last 168 hours. D-Dimer No results for input(s): "DDIMER" in the last 72 hours. Hgb A1c No results for input(s):  "HGBA1C" in the last 72 hours. Lipid Profile No results for input(s): "CHOL", "HDL", "LDLCALC", "TRIG", "CHOLHDL", "LDLDIRECT" in the last 72 hours. Thyroid function studies No results for input(s): "TSH", "T4TOTAL", "T3FREE", "THYROIDAB" in the last 72 hours.  Invalid input(s): "FREET3" Anemia work up No results for input(s): "VITAMINB12", "FOLATE", "FERRITIN", "TIBC", "IRON", "RETICCTPCT" in the last 72 hours. Urinalysis    Component Value Date/Time   COLORURINE YELLOW (A) 05/26/2022 0953   APPEARANCEUR HAZY (A) 05/26/2022 0953   LABSPEC 1.002 (L) 05/26/2022 0953   PHURINE 7.0 05/26/2022 0953   GLUCOSEU NEGATIVE 05/26/2022 0953   HGBUR MODERATE (A) 05/26/2022 0953   BILIRUBINUR NEGATIVE 05/26/2022 0953   KETONESUR 5 (A) 05/26/2022 0953   PROTEINUR 30 (A) 05/26/2022 0953   NITRITE NEGATIVE 05/26/2022 0953   LEUKOCYTESUR LARGE (A) 05/26/2022 0953   Sepsis Labs Recent Labs  Lab 05/26/22 0844 05/27/22 0444 05/28/22 0428  WBC 19.4* 12.5* 8.6   Microbiology Recent Results (from the past 240 hour(s))  Resp Panel by RT-PCR (Flu A&B, Covid)     Status: None   Collection Time: 05/26/22  9:07 AM   Specimen: Nasal Swab  Result Value Ref Range Status   SARS Coronavirus 2 by RT PCR NEGATIVE NEGATIVE Final    Comment: (NOTE) SARS-CoV-2 target nucleic acids are NOT DETECTED.  The SARS-CoV-2 RNA is generally detectable in upper respiratory specimens during the acute phase of infection. The lowest concentration of SARS-CoV-2 viral copies this assay can detect is 138 copies/mL. A negative result does not preclude SARS-Cov-2 infection and should not be used as the sole basis for treatment or other patient management decisions. A negative result may occur with  improper specimen collection/handling, submission of specimen other than nasopharyngeal swab, presence of viral mutation(s) within the areas targeted by this assay, and inadequate number of viral copies(<138 copies/mL). A  negative result must be combined with clinical observations, patient history, and epidemiological information. The expected result is Negative.  Fact Sheet for Patients:  EntrepreneurPulse.com.au  Fact Sheet for Healthcare Providers:  IncredibleEmployment.be  This test is no t yet approved or cleared by the [place] FDA and  has been authorized for detection and/or diagnosis of SARS-CoV-2 by FDA under an Emergency Use Authorization (EUA). This EUA will remain  in effect (meaning this test can be used) for the duration of the COVID-19 declaration under Section 564(b)(1) of the Act, 21 U.S.C.section 360bbb-3(b)(1), unless the authorization is terminated  or revoked sooner.       Influenza A by PCR NEGATIVE NEGATIVE Final   Influenza B by PCR NEGATIVE NEGATIVE Final    Comment: (NOTE) The Xpert Xpress SARS-CoV-2/FLU/RSV plus assay is intended as an aid in the diagnosis of influenza from Nasopharyngeal swab specimens and should not be used as a sole basis for treatment. Nasal washings and aspirates are unacceptable for Xpert Xpress SARS-CoV-2/FLU/RSV testing.  Fact Sheet for Patients: EntrepreneurPulse.com.au  Fact Sheet for Healthcare Providers: IncredibleEmployment.be  This test is not yet approved or cleared by the [place] FDA and has been authorized for detection and/or diagnosis of SARS-CoV-2 by FDA under an Emergency Use Authorization (EUA). This EUA will remain in effect (meaning this test can be used) for the duration of the COVID-19 declaration under Section 564(b)(1) of the Act, 21 U.S.C. section 360bbb-3(b)(1), unless the authorization is terminated or revoked.  Performed at [facility], [address] [place], [place] [address]   Gastrointestinal Panel by PCR , Stool     Status: None   Collection Time: 05/26/22  9:25 AM   Specimen: Stool  Result Value Ref Range Status    Campylobacter species NOT DETECTED NOT DETECTED Final   Plesimonas shigelloides NOT DETECTED NOT DETECTED Final   Salmonella species NOT DETECTED NOT DETECTED Final   Yersinia enterocolitica NOT DETECTED NOT DETECTED Final   Vibrio species NOT DETECTED NOT DETECTED Final   Vibrio cholerae NOT DETECTED NOT DETECTED Final   Enteroaggregative E coli (EAEC) NOT DETECTED NOT DETECTED Final   Enteropathogenic E coli (EPEC) NOT DETECTED NOT DETECTED Final   Enterotoxigenic E coli (ETEC) NOT DETECTED NOT DETECTED Final   Shiga like toxin producing E coli (STEC) NOT DETECTED NOT DETECTED Final   Shigella/Enteroinvasive E coli (EIEC) NOT DETECTED NOT DETECTED Final   Cryptosporidium NOT DETECTED NOT DETECTED Final   Cyclospora cayetanensis NOT DETECTED NOT DETECTED Final   Entamoeba histolytica NOT DETECTED NOT DETECTED Final   Giardia lamblia NOT DETECTED NOT DETECTED Final   Adenovirus F40/41 NOT DETECTED NOT DETECTED Final   Astrovirus NOT DETECTED NOT DETECTED Final   Norovirus GI/GII NOT DETECTED NOT DETECTED Final   Rotavirus A NOT DETECTED NOT DETECTED Final   Sapovirus (I, II, IV, and V) NOT DETECTED NOT DETECTED Final    Comment: Performed at [facility], [address],  [place], [place] [address]  [place] rt PCR ([place] only)     Status: None   Collection Time: 05/26/22  9:53 AM  Result Value Ref Range Status   Specimen source GC/Chlam ENDOCERVICAL  Final   Chlamydia Tr NOT DETECTED NOT DETECTED Final   N gonorrhoeae NOT DETECTED NOT DETECTED Final    Comment: (NOTE) This CT/NG assay has not been evaluated in patients with a history of  hysterectomy. Performed at [facility], [address] [place], [place] [address]   Wet prep, genital     Status: Abnormal   Collection Time: 05/26/22  9:53 AM  Result Value Ref Range Status   Yeast Wet Prep HPF POC NONE SEEN NONE SEEN Final   Trich, Wet Prep NONE SEEN NONE SEEN Final   Clue Cells Wet Prep HPF POC  PRESENT (A) NONE SEEN Final   WBC, Wet Prep HPF POC >=10 (A) <10 Final   Sperm NONE SEEN  Final    Comment: Performed at [facility], [address], [place], [place] [address]  Urine Culture     Status: Abnormal   Collection Time: 05/26/22  9:53 AM   Specimen: Urine, Clean Catch  Result Value Ref Range Status   Specimen Description   Final    URINE, CLEAN CATCH Performed at [facility], [address] [place], [place] [address]    Special Requests   Final    NONE Performed at [facility], [address] [place], [place] [address]    Culture 50,000 COLONIES/mL ESCHERICHIA COLI (A)  Final   Report Status 05/28/2022 FINAL  Final   Organism ID, Bacteria ESCHERICHIA COLI (A)  Final      Susceptibility   Escherichia coli - MIC*    AMPICILLIN >=32 RESISTANT Resistant     CEFAZOLIN <=4 SENSITIVE Sensitive     CEFEPIME <=0.12 SENSITIVE Sensitive     CEFTRIAXONE <=0.25 SENSITIVE Sensitive     CIPROFLOXACIN <=0.25 SENSITIVE Sensitive     GENTAMICIN <=1 SENSITIVE Sensitive     IMIPENEM <=0.25 SENSITIVE Sensitive     NITROFURANTOIN <=16 SENSITIVE Sensitive     TRIMETH/SULFA <=20 SENSITIVE Sensitive     AMPICILLIN/SULBACTAM >=32 RESISTANT Resistant     PIP/TAZO <=4 SENSITIVE Sensitive     * 50,000 COLONIES/mL ESCHERICHIA COLI  Blood culture (routine x 2)     Status: None (Preliminary result)   Collection Time: 05/26/22 11:29 AM   Specimen: BLOOD LEFT ARM  Result Value Ref Range Status   Specimen Description BLOOD LEFT ARM  Final   Special Requests   Final    BOTTLES DRAWN AEROBIC AND ANAEROBIC Blood Culture adequate volume   Culture   Final    NO GROWTH 3 DAYS Performed at [facility], [address], [place], [place] [address]    Report Status PENDING  Incomplete  Blood culture (routine x 2)     Status: None (Preliminary result)   Collection Time: 05/26/22 11:29 AM   Specimen: BLOOD RIGHT ARM  Result Value Ref Range Status   Specimen  Description BLOOD RIGHT ARM  Final   Special Requests   Final    BOTTLES DRAWN AEROBIC AND ANAEROBIC Blood Culture results may not be optimal due to an excessive volume of blood received in culture bottles   Culture   Final    NO GROWTH 3 DAYS Performed at [facility], [address], [place], [place] [address]    Report Status PENDING  Incomplete  C Difficile Quick Screen w PCR reflex     Status: None   Collection Time: 05/26/22  1:43 PM   Specimen: STOOL  Result Value Ref Range Status   C Diff antigen NEGATIVE NEGATIVE Final   C Diff toxin NEGATIVE NEGATIVE Final   C Diff interpretation No C. difficile detected.  Final    Comment: Performed at [facility], [address] [place], [place] [address]   Imaging ECHOCARDIOGRAM COMPLETE  Result Date: 05/27/2022    ECHOCARDIOGRAM REPORT   Patient Name:   NATTIE LAZENBY Date of Exam: 05/27/2022 Medical Rec #:  [id number]        Height:       64.0 in Accession #:    [id number]       Weight:       111.0 lb Date of Birth:  2001-09-26         BSA:          1.523 m Patient Age:    21 years         BP:           125/74 mmHg Patient Gender: F                HR:           92 bpm. Exam Location:  [facility] Procedure: 2D Echo, Color Doppler and Cardiac Doppler Indications:     R07.9 Chest Pain  History:         Patient has no prior history of Echocardiogram examinations.                  Signs/Symptoms:Fever.  Sonographer:     Charmayne Sheer Referring Phys:  Bailey Diagnosing Phys: [name] MD  Sonographer Comments: Suboptimal apical window. IMPRESSIONS  1. Left ventricular ejection fraction, by estimation, is 60 to 65%. The left ventricle has normal function. The left ventricle has no regional wall motion abnormalities. Left ventricular diastolic parameters were normal.  2. Right ventricular systolic function is normal. The right ventricular size is normal. Tricuspid regurgitation signal is inadequate for assessing PA pressure.  3.  The mitral valve is normal in structure. No evidence of mitral valve regurgitation. No evidence of mitral stenosis.  4. The aortic valve is normal in structure. Aortic valve regurgitation is not visualized. No aortic stenosis is present.  5. The inferior vena cava is normal in size with greater than 50% respiratory variability, suggesting right atrial pressure of 3 mmHg. FINDINGS  Left Ventricle: Left ventricular ejection fraction, by estimation, is 60 to 65%. The left ventricle has normal function. The left ventricle has no regional wall motion abnormalities. The left ventricular internal cavity size was normal in size. There is  no left ventricular hypertrophy. Left ventricular diastolic parameters were normal. Right Ventricle: The right ventricular size is normal. No increase in right ventricular wall thickness. Right ventricular systolic function is normal. Tricuspid regurgitation signal is inadequate for assessing PA pressure. Left Atrium: Left atrial size was normal in size. Right Atrium: Right atrial size was normal in size. Pericardium: There is no evidence of pericardial effusion. Mitral Valve: The mitral valve is normal in structure. No evidence of mitral valve regurgitation. No evidence of mitral valve stenosis. Tricuspid Valve: The tricuspid valve is normal in structure. Tricuspid valve regurgitation is not demonstrated. No evidence of tricuspid stenosis. Aortic Valve: The aortic valve is normal in structure. Aortic valve regurgitation is not visualized. No aortic stenosis is present. Aortic valve mean  gradient measures 4.0 mmHg. Aortic valve peak gradient measures 6.1 mmHg. Aortic valve area, by VTI measures 2.11 cm. Pulmonic Valve: The pulmonic valve was normal in structure. Pulmonic valve regurgitation is not visualized. No evidence of pulmonic stenosis. Aorta: The aortic root is normal in size and structure. Venous: The inferior vena cava is normal in size with greater than 50% respiratory  variability, suggesting right atrial pressure of 3 mmHg. IAS/Shunts: No atrial level shunt detected by color flow Doppler.  LEFT VENTRICLE PLAX 2D LVIDd:         4.12 cm     Diastology LVIDs:         2.35 cm     LV e' medial:    12.60 cm/s LV PW:         0.98 cm     LV E/e' medial:  6.6 LV IVS:        0.67 cm     LV e' lateral:   15.00 cm/s LVOT diam:     1.80 cm     LV E/e' lateral: 5.6 LV SV:         45 LV SV Index:   30 LVOT Area:     2.54 cm  LV Volumes (MOD) LV vol d, MOD A4C: 96.1 ml LV vol s, MOD A4C: 28.7 ml LV SV MOD A4C:     96.1 ml RIGHT VENTRICLE RV Basal diam:  3.14 cm TAPSE (M-mode): 2.8 cm LEFT ATRIUM         Index       RIGHT ATRIUM           Index LA diam:    2.80 cm 1.84 cm/m  RA Area:     14.20 cm                                 RA Volume:   35.00 ml  22.98 ml/m  AORTIC VALVE                    PULMONIC VALVE AV Area (Vmax):    2.21 cm     PV Vmax:       0.84 m/s AV Area (Vmean):   2.14 cm     PV Peak grad:  2.8 mmHg AV Area (VTI):     2.11 cm AV Vmax:           123.00 cm/s AV Vmean:          93.300 cm/s AV VTI:            0.213 m AV Peak Grad:      6.1 mmHg AV Mean Grad:      4.0 mmHg LVOT Vmax:         107.00 cm/s LVOT Vmean:        78.500 cm/s LVOT VTI:          0.177 m LVOT/AV VTI ratio: 0.83  AORTA Ao Root diam: 2.90 cm MITRAL VALVE MV Area (PHT): 3.58 cm    SHUNTS MV Decel Time: 212 msec    Systemic VTI:  0.18 m MV E velocity: 83.70 cm/s  Systemic Diam: 1.80 cm MV A velocity: 45.90 cm/s MV E/A ratio:  1.82 [name] MD Electronically signed by [name] MD Signature Date/Time: 05/27/2022/5:39:50 PM    Final       Time coordinating discharge: Over 30 minutes  SIGNED:  Emeterio Reeve DO [facility]

## 2022-05-31 LAB — CULTURE, BLOOD (ROUTINE X 2)
Culture: NO GROWTH
Culture: NO GROWTH
Special Requests: ADEQUATE

## 2022-07-15 ENCOUNTER — Telehealth: Payer: BC Managed Care – PPO | Admitting: Physician Assistant

## 2022-07-15 DIAGNOSIS — B3731 Acute candidiasis of vulva and vagina: Secondary | ICD-10-CM

## 2022-07-15 MED ORDER — FLUCONAZOLE 150 MG PO TABS: 150.0000 mg | ORAL_TABLET | Freq: Once | ORAL | 0 refills | Status: AC

## 2022-07-15 NOTE — Progress Notes (Signed)
I have spent 5 minutes in review of e-visit questionnaire, review and updating patient chart, medical decision making and response to patient.   Zailyn Rowser Cody Jancarlos Thrun, PA-C    

## 2022-07-15 NOTE — Progress Notes (Signed)

## 2022-08-26 ENCOUNTER — Ambulatory Visit: Payer: Self-pay | Admitting: Physician Assistant

## 2022-08-26 ENCOUNTER — Encounter: Payer: Self-pay | Admitting: Physician Assistant

## 2022-08-26 DIAGNOSIS — Z113 Encounter for screening for infections with a predominantly sexual mode of transmission: Secondary | ICD-10-CM

## 2022-08-26 LAB — WET PREP FOR TRICH, YEAST, CLUE
Trichomonas Exam: NEGATIVE
Yeast Exam: NEGATIVE

## 2022-08-26 NOTE — Progress Notes (Signed)
Patient desires to self collect vaginal samples. Denies STD symptoms. Here for screening only. 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 Wet Prep reviewed during visit - WNL. BThiele RN

## 2022-08-26 NOTE — Progress Notes (Signed)
[facility] Department  STI clinic/screening visit [place] [address] [phone]  Subjective:  Margaret Dunn is a 21 y.o. female being seen today for an STI screening visit. The patient reports they do not have symptoms.  Patient reports that they do not desire a pregnancy in the next year.   They reported they are not interested in discussing contraception today.    Patient's last menstrual period was 07/30/2022 (approximate). Using IUD for contraception.   Patient has the following medical conditions:   Patient Active Problem List   Diagnosis Date Noted   Acute pyelonephritis 05/26/2022   Sepsis ([place]) 05/26/2022   Bacterial vaginosis 05/26/2022   Electrolyte disturbance 05/26/2022   Hyponatremia 05/26/2022   Hypokalemia 05/26/2022   Hypomagnesemia 05/26/2022   Chest pain 05/26/2022   Nausea vomiting and diarrhea 05/26/2022   Hypophosphatemia 05/26/2022   Iron deficiency anemia due to chronic blood loss 03/18/2021   IUD (intrauterine device) in place 12/25/2018   Vitamin D deficiency 10/11/2018   Iron deficiency anemia 10/22/2016   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[place]) 10/21/2016   Hip strain, right, initial encounter 10/21/2016   Lower back pain 10/21/2016   Failed hearing screening 07/15/2016    Chief Complaint  Patient presents with   STI Screening    Young woman here for STI screen only. Asymptomatic, no known sick contacts.   Does the patient using douching products? No  Last HIV test per patient/review of record was No results found for: "HMHIVSCREEN"  Lab Results  Component Value Date   HIV Non Reactive 05/26/2022   Patient reports last pap was No results found for: "DIAGPAP"   Screening for MPX risk: Does the patient have an unexplained rash? No Is the patient MSM? No Does the patient endorse multiple sex partners or anonymous sex partners? No Did the patient have close or sexual contact with a person diagnosed with  MPX? No Has the patient traveled outside the [place] where MPX is endemic? No Is there a high clinical suspicion for MPX-- evidenced by one of the following No  -Unlikely to be chickenpox  -Lymphadenopathy  -Rash that present in same phase of evolution on any given body part See flowsheet for further details and programmatic requirements.   Immunization history:  Immunization History  Administered Date(s) Administered   HPV Quadrivalent 01/30/2008, 04/08/2008, 07/31/2008   Hepatitis A, Ped/Adol-2 Dose 01/30/2008   Hepatitis B, PED/ADOLESCENT 03/25/1999, 06/01/1999, 07/27/1999   Influenza,inj,Quad PF,6+ Mos 07/15/2016, 07/22/2017, 10/10/2018, 10/15/2019, 07/18/2020   MMR 01/26/2000, 11/04/2003   PFIZER(Purple Top)SARS-COV-2 Vaccination 01/08/2020, 02/07/2020   Tdap 10/10/2018   Varicella 01/26/2000     The following portions of the patient's history were reviewed and updated as appropriate: allergies, current medications, past medical history, past social history, past surgical history and problem list.  Objective:  There were no vitals filed for this visit.  Physical Exam Vitals and nursing note reviewed.  Constitutional:      Appearance: Normal appearance.  HENT:     Head: Normocephalic and atraumatic.     Mouth/Throat:     Mouth: Mucous membranes are moist.     Pharynx: Oropharynx is clear. No oropharyngeal exudate or posterior oropharyngeal erythema.  Pulmonary:     Effort: Pulmonary effort is normal.  Abdominal:     General: Abdomen is flat.     Palpations: There is no mass.     Tenderness: There is no abdominal tenderness. There is no rebound.  Genitourinary:    Comments: pH =  not tested (pt self-collected specimen) Lymphadenopathy:     Head:     Right side of head: No preauricular or posterior auricular adenopathy.     Left side of head: No preauricular or posterior auricular adenopathy.     Cervical: No cervical adenopathy.     Upper Body:     Right upper body: No  supraclavicular, axillary or epitrochlear adenopathy.     Left upper body: No supraclavicular, axillary or epitrochlear adenopathy.  Skin:    General: Skin is warm and dry.     Findings: No rash.  Neurological:     Mental Status: She is alert and oriented to person, place, and time.    Pt declined pelvic exam. Preferred specimen self-collection.  Assessment and Plan:  Maximina Pirozzi is a 21 y.o. female presenting to the [facility] Department for STI screening  1. Screening for STD (sexually transmitted disease) Wet prep neg. Await Chlam/GC test results. Rec consistent condom use. - WET PREP FOR [place], YEAST, [facility]   Return in about 6 months (around 02/24/2023) for STI screening.  No future appointments.  Lora Havens, PA-C

## 2022-09-23 ENCOUNTER — Encounter: Payer: Self-pay | Admitting: Family Medicine

## 2022-09-23 ENCOUNTER — Ambulatory Visit: Payer: BC Managed Care – PPO | Admitting: Family Medicine

## 2022-09-23 VITALS — BP 122/72 | HR 92 | Temp 98.6°F | Resp 16 | Wt 113.4 lb

## 2022-09-23 DIAGNOSIS — R41840 Attention and concentration deficit: Secondary | ICD-10-CM | POA: Diagnosis not present

## 2022-09-23 DIAGNOSIS — G479 Sleep disorder, unspecified: Secondary | ICD-10-CM

## 2022-09-23 DIAGNOSIS — F419 Anxiety disorder, unspecified: Secondary | ICD-10-CM

## 2022-09-23 DIAGNOSIS — F32A Depression, unspecified: Secondary | ICD-10-CM

## 2022-09-23 MED ORDER — BUSPIRONE HCL 5 MG PO TABS: 5.0000 mg | ORAL_TABLET | Freq: Three times a day (TID) | ORAL | 2 refills | Status: DC | PRN

## 2022-09-23 MED ORDER — HYDROXYZINE PAMOATE 25 MG PO CAPS: 25.0000 mg | ORAL_CAPSULE | Freq: Every evening | ORAL | 0 refills | Status: DC | PRN

## 2022-09-23 NOTE — Progress Notes (Signed)
Patient ID: Margaret Dunn, female    DOB: 2001-02-14, 21 y.o.   MRN: [id number]  PCP: Delsa Grana, PA-C  Chief Complaint  Patient presents with   Mental Health Problem   Referral    Subjective:   Margaret Dunn is a 21 y.o. female, presents to clinic with CC of the following:  HPI   Pt presents requesting psych referral for trouble with regulating emotions and trouble with focus  She has noticed the symptoms for years (21 y/o) but she thought it may be due to her hormonal birth control, however she got off that and her sx have not improved only worsened. She is doing therapy Lagreta cameron -   - therapist in [place] saw her through virtual visits during covid  No other psych hx or eval Phq 9 and gad 7 reviewed and positive    09/23/2022    3:08 PM 09/03/2021   11:01 AM 03/04/2021    2:17 PM  Depression screen PHQ 2/9  Decreased Interest 3 0 0  Down, Depressed, Hopeless 2 0 0  PHQ - 2 Score 5 0 0  Altered sleeping 3 0 0  Tired, decreased energy 3 0 0  Change in appetite 3 0 0  Feeling bad or failure about yourself  1 0 0  Trouble concentrating 3 0 0  Moving slowly or fidgety/restless 1 0 0  Suicidal thoughts 0 0 0  PHQ-9 Score 19 0 0  Difficult doing work/chores Very difficult Not difficult at all Not difficult at all      09/23/2022    3:09 PM 12/25/2018    2:42 PM 10/10/2018   11:01 AM  GAD 7 : Generalized Anxiety Score  Nervous, Anxious, on Edge 2 0 2  Control/stop worrying 2 0 3  Worry too much - different things 3 0 3  Trouble relaxing 1 0 2  Restless 2 0 0  Easily annoyed or irritable 3 0 3  Afraid - awful might happen 3 0 3  Total GAD 7 Score 16 0 16  Anxiety Difficulty Very difficult Not difficult at all Somewhat difficult   Teacher, notes significant stress, 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- trouble keeping up with life in general  No hx of being on meds previously Father possibly bipolar no other known family psych hx     Patient  Active Problem List   Diagnosis Date Noted   Acute pyelonephritis 05/26/2022   Sepsis ([place]) 05/26/2022   Bacterial vaginosis 05/26/2022   Electrolyte disturbance 05/26/2022   Hyponatremia 05/26/2022   Hypokalemia 05/26/2022   Hypomagnesemia 05/26/2022   Chest pain 05/26/2022   Nausea vomiting and diarrhea 05/26/2022   Hypophosphatemia 05/26/2022   Iron deficiency anemia due to chronic blood loss 03/18/2021   IUD (intrauterine device) in place 12/25/2018   Vitamin D deficiency 10/11/2018   Iron deficiency anemia 10/22/2016   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[place]) 10/21/2016   Hip strain, right, initial encounter 10/21/2016   Lower back pain 10/21/2016   Failed hearing screening 07/15/2016      Current Outpatient Medications:    Multiple Vitamins-Minerals (MULTIVITAMIN WITH MINERALS) tablet, Take 1 tablet by mouth daily., Disp: , Rfl:    PARAGARD INTRAUTERINE COPPER IU, by Intrauterine route., Disp: , Rfl:    No Known Allergies   Social History   Tobacco Use   Smoking status: Never   Smokeless tobacco: Never  Vaping Use   Vaping Use: Never used  Substance Use Topics   Alcohol use: Yes  Comment: 2 times a week   Drug use: No      Chart Review Today: I personally reviewed active problem list, medication list, allergies, family history, social history, health maintenance, notes from last encounter, lab results, imaging with the patient/caregiver today.   Review of Systems  Constitutional: Negative.   HENT: Negative.    Eyes: Negative.   Respiratory: Negative.    Cardiovascular: Negative.   Gastrointestinal: Negative.   Endocrine: Negative.   Genitourinary: Negative.   Musculoskeletal: Negative.   Skin: Negative.   Allergic/Immunologic: Negative.   Neurological: Negative.   Hematological: Negative.   Psychiatric/Behavioral: Negative.    All other systems reviewed and are negative.      Objective:   Vitals:   09/23/22 1505 09/23/22 1508  BP: 122/72   Pulse:  (!) 109 92  Resp: 16   Temp: 98.6 F (37 C)   SpO2: 100%   Weight: 113 lb 6.4 oz (51.4 kg)     Body mass index is 19.47 kg/m.  Physical Exam Vitals and nursing note reviewed.  Constitutional:      Appearance: She is well-developed.  HENT:     Head: Normocephalic and atraumatic.     Nose: Nose normal.  Eyes:     General:        Right eye: No discharge.        Left eye: No discharge.     Conjunctiva/sclera: Conjunctivae normal.  Neck:     Trachea: No tracheal deviation.  Cardiovascular:     Rate and Rhythm: Normal rate and regular rhythm.  Pulmonary:     Effort: Pulmonary effort is normal. No respiratory distress.     Breath sounds: No stridor.  Musculoskeletal:        General: Normal range of motion.  Skin:    General: Skin is warm and dry.     Findings: No rash.  Neurological:     Mental Status: She is alert.     Motor: No abnormal muscle tone.     Coordination: Coordination normal.  Psychiatric:        Attention and Perception: Attention normal.        Mood and Affect: Mood is depressed. Mood is not anxious. Affect is not tearful.        Speech: Speech normal.        Behavior: Behavior normal. Behavior is cooperative.        Thought Content: Thought content normal. Thought content does not include homicidal or suicidal ideation. Thought content does not include homicidal or suicidal plan.      Results for orders placed or performed in visit on 08/26/22  WET PREP FOR [place], YEAST, CLUE  Result Value Ref Range   Trichomonas Exam Negative Negative   Yeast Exam Negative Negative   Clue Cell Exam Comment: Negative       Assessment & Plan:   Pt presents with positive PHQ 9 and GAD 7, endorsing difficulty with regulating emotions, sleep disturbances, trouble staying focused, she has noted most of the symptoms for about 5 years but they are much worse recently despite even working with a therapist for some time now.  She would like to get a formal evaluation for  ADHD and for her moods.  She would like to do in person psychiatric assessment in the [place] area.  There is history of bipolar possibly in the family -her father but no other psychiatric history that she knows of.  She has never been on medications  before.  Phq 9 and gad 7 reviewed and positive -she denies SI/HI  Encouraged her to look or call her insurance to find out in network psychiatric clinics or provider so we can get the referral to the correct places in the area, she was also given handout with multiple local clinics to call and I put information for [facility] on her paperwork.  I explained to her that although I can and will enter the referral, patients will get appointments faster if they are able to call the clinics and make the appointment himself and also confirmed that the practice or provider is in their insurance network.    ICD-10-CM   1. Attention and concentration deficit  R41.840 Ambulatory referral to Psychiatry    2. Anxiety  F41.9 Ambulatory referral to Psychiatry    busPIRone (BUSPAR) 5 MG tablet    hydrOXYzine (VISTARIL) 25 MG capsule    3. Depression, unspecified depression type  F32.A Ambulatory referral to Psychiatry    4. Sleep disturbance  G47.9 Ambulatory referral to Psychiatry    hydrOXYzine (VISTARIL) 25 MG capsule     Attempted to call pt 4:29 PM  to follow up on what she was able to find out and review med options that were sent to the pharmacy. VM full and unable to leave a message. Will send mychart msg to pt to che  Adult ADHD Self-Report Scale (ASRS-v1.1) Symptom Checklist  Part A 1. How often do you have trouble wrapping up the final details of a project, once the challenging parts have been done?: (!) Often 2. How often do you have difficulty getting things done in order when you have to do a task that requires organization?: (!) Sometimes 3. How often do you have problems remembering appointments or  obligations?: (!) Often 4. When you have a task that requires a lot of thought, how often do you avoid or delay getting started?: (!) Often 5. How often do you fidget or squirm with your hands or feet when you have to sit down for a long time?: (!) Often 6. How often do you feel overly active and compelled to do things, like you were driven by a motor?: (!) Often  Part B 7. How often do you make careless mistakes when you have to work on a boring or difficult project?: (!) Often 8. How often do you have difficulty keeping your attention when you are doing boring or repetitive work?: (!) Very Often 9. How often do you have difficulty concentrating on what people say to you, even when they are speaking to you directly?: (!) Very Often 10. How often do you misplace or have difficulty finding things at home or at work?: (!) Very Often 27. How often are you distracted by activity or noise around you?: (!) Very Often 12. How often do you leave your seat in meetings or other situations in which you are expected to remain seated?: (!) Often 13. How often do you feel restless or fidgety?: (!) Very Often 14. How often do you have difficulty unwinding and relaxing when you have time to yourself?: (!) Often 15. How often do you find yourself talking too much when you are in social situations?: (!) Very Often 16. When you are in a conversation, how often do you find yourself finishing the sentences of the people you are talking to, before they can finish them themselves?: (!) Very Often 17. How often do you have difficulty waiting your turn in  situations when turn taking is required?: (!) Often 18. How often do you interrupt others when they are busy?: (!) Often How old were you when these problems first began to occur?: 16]    Delsa Grana, PA-C 09/23/22 3:20 PM

## 2022-09-23 NOTE — Patient Instructions (Addendum)
[email or web address] [phone]   [facility] or [facility] are also in the area

## 2022-11-14 ENCOUNTER — Telehealth (INDEPENDENT_AMBULATORY_CARE_PROVIDER_SITE_OTHER): Payer: Self-pay

## 2022-11-15 ENCOUNTER — Ambulatory Visit: Payer: BC Managed Care – PPO

## 2022-11-22 ENCOUNTER — Other Ambulatory Visit: Payer: Self-pay | Admitting: Family Medicine

## 2022-11-22 DIAGNOSIS — F419 Anxiety disorder, unspecified: Secondary | ICD-10-CM

## 2022-11-22 DIAGNOSIS — G479 Sleep disorder, unspecified: Secondary | ICD-10-CM

## 2022-11-29 ENCOUNTER — Encounter: Payer: Self-pay | Admitting: Family Medicine

## 2022-12-02 ENCOUNTER — Ambulatory Visit: Payer: BC Managed Care – PPO | Admitting: Family Medicine

## 2022-12-02 ENCOUNTER — Encounter: Payer: Self-pay | Admitting: Family Medicine

## 2022-12-02 VITALS — BP 128/76 | HR 100 | Temp 98.2°F | Resp 16 | Ht 63.0 in | Wt 113.3 lb

## 2022-12-02 DIAGNOSIS — F419 Anxiety disorder, unspecified: Secondary | ICD-10-CM

## 2022-12-02 DIAGNOSIS — R5383 Other fatigue: Secondary | ICD-10-CM

## 2022-12-02 DIAGNOSIS — B009 Herpesviral infection, unspecified: Secondary | ICD-10-CM

## 2022-12-02 DIAGNOSIS — N920 Excessive and frequent menstruation with regular cycle: Secondary | ICD-10-CM

## 2022-12-02 DIAGNOSIS — F339 Major depressive disorder, recurrent, unspecified: Secondary | ICD-10-CM

## 2022-12-02 DIAGNOSIS — F4323 Adjustment disorder with mixed anxiety and depressed mood: Secondary | ICD-10-CM

## 2022-12-02 DIAGNOSIS — D500 Iron deficiency anemia secondary to blood loss (chronic): Secondary | ICD-10-CM

## 2022-12-02 MED ORDER — SERTRALINE HCL 50 MG PO TABS: 50.0000 mg | ORAL_TABLET | Freq: Every day | ORAL | 3 refills | Status: AC

## 2022-12-02 MED ORDER — BUSPIRONE HCL 5 MG PO TABS: 5.0000 mg | ORAL_TABLET | Freq: Three times a day (TID) | ORAL | 2 refills | Status: AC | PRN

## 2022-12-02 NOTE — Assessment & Plan Note (Signed)
Heavy bleeding with periods lasting 9+ days despite IUD Recheck labs, pt with generalized fatigue

## 2022-12-02 NOTE — Assessment & Plan Note (Signed)
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

## 2022-12-02 NOTE — Assessment & Plan Note (Signed)
Buspar med helps a little, she is still working with her therapist PHQ and GAD 7 scores are positive but not as high as in December Continue therapy Start zoloft, cont buspar prn Close f/up

## 2022-12-02 NOTE — Progress Notes (Signed)
Name: Margaret Dunn   MRN: [id number]    DOB: 2001-04-16   Date:12/02/2022       Progress Note  Chief Complaint  Patient presents with   Follow-up   Anxiety     Subjective:   Margaret Dunn is a 22 y.o. female, presents to clinic for f/up on meds  Anxiety/depression and stress:    12/02/2022    2:58 PM 09/23/2022    3:08 PM 09/03/2021   11:01 AM  Depression screen PHQ 2/9  Decreased Interest 1 3 0  Down, Depressed, Hopeless 1 2 0  PHQ - 2 Score 2 5 0  Altered sleeping 1 3 0  Tired, decreased energy 1 3 0  Change in appetite 0 3 0  Feeling bad or failure about yourself  0 1 0  Trouble concentrating 0 3 0  Moving slowly or fidgety/restless 0 1 0  Suicidal thoughts 0 0 0  PHQ-9 Score 4 19 0  Difficult doing work/chores Somewhat difficult Very difficult Not difficult at all      12/02/2022    3:03 PM 09/23/2022    3:09 PM 12/25/2018    2:42 PM 10/10/2018   11:01 AM  GAD 7 : Generalized Anxiety Score  Nervous, Anxious, on Edge 2 2 0 2  Control/stop worrying 2 2 0 3  Worry too much - different things 2 3 0 3  Trouble relaxing 2 1 0 2  Restless 2 2 0 0  Easily annoyed or irritable 0 3 0 3  Afraid - awful might happen 0 3 0 3  Total GAD 7 Score 10 16 0 16  Anxiety Difficulty Somewhat difficult Very difficult Not difficult at all Somewhat difficult  She's holding her breath when she is stressed or anxious Working with her therapist since last OV  Has an upcoming appt with psychiatry Appetite down when stressed  Weight stable relative to last OV Wt Readings from Last 5 Encounters:  12/02/22 113 lb 4.8 oz (51.4 kg)  09/23/22 113 lb 6.4 oz (51.4 kg)  05/26/22 111 lb (50.3 kg)  03/18/21 127 lb 6.4 oz (57.8 kg)  03/04/21 125 lb 9.6 oz (57 kg)   BMI Readings from Last 5 Encounters:  12/02/22 20.07 kg/m  09/23/22 19.47 kg/m  05/26/22 19.05 kg/m  03/18/21 22.57 kg/m  03/04/21 22.25 kg/m   Work still stressful Energy down, not motivated   Anemia- heavy  cycles with IUD just saw GYN Hemoglobin  Date Value Ref Range Status  05/28/2022 10.2 (L) 12.0 - 15.0 g/dL Final  05/27/2022 9.7 (L) 12.0 - 15.0 g/dL Final  05/26/2022 12.4 12.0 - 15.0 g/dL Final  05/25/2021 12.5 12.0 - 15.0 g/dL Final   Generalized fatigue - she is exhausted at the end of every day of work.   Current Outpatient Medications:    hydrOXYzine (VISTARIL) 25 MG capsule, TAKE 1 CAPSULE(25 MG) BY MOUTH AT BEDTIME AS NEEDED FOR INSOMNIA, Disp: 30 capsule, Rfl: 0   Multiple Vitamins-Minerals (MULTIVITAMIN WITH MINERALS) tablet, Take 1 tablet by mouth daily., Disp: , Rfl:    PARAGARD INTRAUTERINE COPPER IU, by Intrauterine route., Disp: , Rfl:    busPIRone (BUSPAR) 5 MG tablet, Take 1-2 tablets (5-10 mg total) by mouth 3 (three) times daily as needed. (Patient not taking: Reported on 12/02/2022), Disp: 60 tablet, Rfl: 2  Patient Active Problem List   Diagnosis Date Noted   Acute pyelonephritis 05/26/2022   Sepsis ([place]) 05/26/2022   Bacterial vaginosis 05/26/2022   Electrolyte disturbance  05/26/2022   Hyponatremia 05/26/2022   Hypokalemia 05/26/2022   Hypomagnesemia 05/26/2022   Chest pain 05/26/2022   Nausea vomiting and diarrhea 05/26/2022   Hypophosphatemia 05/26/2022   Iron deficiency anemia due to chronic blood loss 03/18/2021   IUD (intrauterine device) in place 12/25/2018   Vitamin D deficiency 10/11/2018   Iron deficiency anemia 10/22/2016   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[place]) 10/21/2016   Hip strain, right, initial encounter 10/21/2016   Lower back pain 10/21/2016   Failed hearing screening 07/15/2016    Past Surgical History:  Procedure Laterality Date   WISDOM TOOTH EXTRACTION      Family History  Problem Relation Age of Onset   Healthy Mother    Healthy Father    Healthy Sister    Healthy Sister    Healthy Sister    Breast cancer Neg Hx    Ovarian cancer Neg Hx    Colon cancer Neg Hx     Social History   Tobacco Use   Smoking status: Never    Smokeless tobacco: Never  Vaping Use   Vaping Use: Never used  Substance Use Topics   Alcohol use: Yes    Comment: 2 times a week   Drug use: No     No Known Allergies  Health Maintenance  Topic Date Due   PAP-Cervical Cytology Screening  Never done   COVID-19 Vaccine (3 - 2023-24 season) 12/18/2022 (Originally 06/18/2022)   INFLUENZA VACCINE  01/16/2023 (Originally 05/18/2022)   CHLAMYDIA SCREENING  05/27/2023   DTaP/Tdap/Td (2 - Td or Tdap) 10/10/2028   HPV VACCINES  Completed   Hepatitis C Screening  Completed   HIV Screening  Completed   PAP SMEAR-Modifier  Discontinued    Chart Review Today: I personally reviewed active problem list, medication list, allergies, family history, social history, health maintenance, notes from last encounter, lab results, imaging with the patient/caregiver today.   Review of Systems  Constitutional: Negative.   HENT: Negative.    Eyes: Negative.   Respiratory: Negative.    Cardiovascular: Negative.   Gastrointestinal: Negative.   Endocrine: Negative.   Genitourinary: Negative.   Musculoskeletal: Negative.   Skin: Negative.   Allergic/Immunologic: Negative.   Neurological: Negative.   Hematological: Negative.   Psychiatric/Behavioral: Negative.    All other systems reviewed and are negative.    Objective:   Vitals:   12/02/22 1458  BP: 128/76  Pulse: 100  Resp: 16  Temp: 98.2 F (36.8 C)  TempSrc: Oral  Weight: 113 lb 4.8 oz (51.4 kg)  Height: 5' 3"$  (1.6 m)    Body mass index is 20.07 kg/m.  Physical Exam Vitals and nursing note reviewed.  Constitutional:      General: She is not in acute distress.    Appearance: Normal appearance. She is well-developed. She is not ill-appearing, toxic-appearing or diaphoretic.  HENT:     Head: Normocephalic and atraumatic.     Nose: Nose normal.  Eyes:     General:        Right eye: No discharge.        Left eye: No discharge.     Conjunctiva/sclera: Conjunctivae normal.  Neck:      Trachea: No tracheal deviation.  Cardiovascular:     Rate and Rhythm: Normal rate and regular rhythm.  Pulmonary:     Effort: Pulmonary effort is normal. No respiratory distress.     Breath sounds: No stridor.  Musculoskeletal:        General: Normal range of motion.  Skin:    General: Skin is warm and dry.     Findings: No rash.  Neurological:     Mental Status: She is alert.     Motor: No abnormal muscle tone.     Coordination: Coordination normal.  Psychiatric:        Attention and Perception: Attention and perception normal.        Mood and Affect: Mood and affect normal.        Speech: Speech normal.        Behavior: Behavior normal. Behavior is cooperative.        Thought Content: Thought content normal. Thought content does not include homicidal or suicidal ideation. Thought content does not include homicidal or suicidal plan.        Cognition and Memory: Cognition normal.        Judgment: Judgment normal.         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 She is working with a therapist and is waiting for a psychiatry appt  Will try SSRI and continue buspar      Relevant Medications   busPIRone (BUSPAR) 5 MG tablet   sertraline (ZOLOFT) 50 MG tablet   Anxiety - Primary    Buspar med helps a little, she is still working with her therapist PHQ and GAD 7 scores are positive but not as high as in December Continue therapy Start zoloft, cont buspar prn Close f/up      Relevant Medications   busPIRone (BUSPAR) 5 MG tablet   sertraline (ZOLOFT) 50 MG tablet    Other Relevant Orders   TSH   Other Visit Diagnoses     Other fatigue       likely multifactorial, recheck IDA, r/o hypothyroid, large aspect of burnout/prolonged stress with anxiety/depression sx   Relevant Orders   CBC with Differential/Platelet   TSH   Hypocalcemia       recheck cmp   Relevant Orders   COMPLETE METABOLIC PANEL WITH GFR   Adjustment disorder with mixed anxiety and depressed mood       Relevant Medications   busPIRone (BUSPAR) 5 MG tablet   sertraline (ZOLOFT) 50 MG tablet   Other Relevant Orders   CBC with Differential/Platelet   TSH        Return for 1 month video visit for meds/mood.   Delsa Grana, PA-C 12/02/22 3:08 PM

## 2022-12-03 DIAGNOSIS — N920 Excessive and frequent menstruation with regular cycle: Secondary | ICD-10-CM | POA: Insufficient documentation

## 2022-12-03 DIAGNOSIS — B009 Herpesviral infection, unspecified: Secondary | ICD-10-CM | POA: Insufficient documentation

## 2022-12-03 LAB — CBC WITH DIFFERENTIAL/PLATELET
Absolute Monocytes: 466 cells/uL (ref 200–950)
Basophils Absolute: 32 cells/uL (ref 0–200)
Basophils Relative: 0.9 %
Eosinophils Absolute: 21 cells/uL (ref 15–500)
Eosinophils Relative: 0.6 %
HCT: 29.1 % — ABNORMAL LOW (ref 35.0–45.0)
Hemoglobin: 9.7 g/dL — ABNORMAL LOW (ref 11.7–15.5)
Lymphs Abs: 2128 cells/uL (ref 850–3900)
MCH: 27.3 pg (ref 27.0–33.0)
MCHC: 33.3 g/dL (ref 32.0–36.0)
MCV: 82 fL (ref 80.0–100.0)
MPV: 9.6 fL (ref 7.5–12.5)
Monocytes Relative: 13.3 %
Neutro Abs: 854 cells/uL — ABNORMAL LOW (ref 1500–7800)
Neutrophils Relative %: 24.4 %
Platelets: 437 10*3/uL — ABNORMAL HIGH (ref 140–400)
RBC: 3.55 10*6/uL — ABNORMAL LOW (ref 3.80–5.10)
RDW: 14.3 % (ref 11.0–15.0)
Total Lymphocyte: 60.8 %
WBC: 3.5 10*3/uL — ABNORMAL LOW (ref 3.8–10.8)

## 2022-12-03 LAB — IRON,TIBC AND FERRITIN PANEL
%SAT: 4 % (calc) — ABNORMAL LOW (ref 16–45)
Ferritin: 8 ng/mL — ABNORMAL LOW (ref 16–154)
Iron: 18 ug/dL — ABNORMAL LOW (ref 40–190)
TIBC: 440 mcg/dL (calc) (ref 250–450)

## 2022-12-03 LAB — COMPLETE METABOLIC PANEL WITH GFR
AG Ratio: 1.5 (calc) (ref 1.0–2.5)
ALT: 10 U/L (ref 6–29)
AST: 18 U/L (ref 10–30)
Albumin: 4.3 g/dL (ref 3.6–5.1)
Alkaline phosphatase (APISO): 45 U/L (ref 31–125)
BUN: 11 mg/dL (ref 7–25)
CO2: 24 mmol/L (ref 20–32)
Calcium: 9.6 mg/dL (ref 8.6–10.2)
Chloride: 104 mmol/L (ref 98–110)
Creat: 0.69 mg/dL (ref 0.50–0.96)
Globulin: 2.9 g/dL (calc) (ref 1.9–3.7)
Glucose, Bld: 82 mg/dL (ref 65–99)
Potassium: 4 mmol/L (ref 3.5–5.3)
Sodium: 137 mmol/L (ref 135–146)
Total Bilirubin: 0.3 mg/dL (ref 0.2–1.2)
Total Protein: 7.2 g/dL (ref 6.1–8.1)
eGFR: 125 mL/min/{1.73_m2} (ref >=60)

## 2022-12-03 LAB — TSH: TSH: 1.62 mIU/L

## 2022-12-03 NOTE — Addendum Note (Signed)
Addended by: Delsa Grana on: 12/03/2022 06:05 PM   Modules accepted: Orders

## 2022-12-03 NOTE — Progress Notes (Signed)
Lab results and msg send to pt's mychart for f/up on her mood/labs/heavy periods   Gila -   I wanted to let you know about your lab results and update you on what I was able to find in your chart.   First labs.  Your anemia is a little worse - hemoglobin is 9.7 and your iron is really low. I do think we should address this by getting you back to hematology (Dr. Tasia Catchings) and also be reducing your blood loss through the heavy periods.  I do encourage you to take an oral iron supplement once a day if you can handle that - and if it causes too much stomach upset or constipation - try it every other day. Your thyroid was normal and so was your electrolytes, kidney function, liver tests and glucose.   I went through your chart as much as I could to see if there was a prior note or warning about birth control and moods.  The only thing I found was a note from Dr. Sanda Klein about a correlation between the depo shot and worsening depression.  That was around 2020 I think and after she already wanted to get you off the depo shot.   That doesn't necessarily mean that you cannot take other forms of hormonal birth control.  Some of the oral birth control pills are actually used to help improve moods related to menstrual cycle.  So I do think a birth control pill may be an option - the pills are actually first line treatment for heavy periods.  (The following info copied from Up To Date a medical database: For abnormal uterine bleeding (AUB - meaning really heavy cycles/menstruation)  "For most patients with AUB  we suggest estrogen-progestin contraceptives as first-line therapy.  Estrogen-progestin contraceptives are effective treatments for AUB, provide effective contraception, are well tolerated, and have a low risk of adverse effects"   So I would llke to get you to GYN to remove your IUD - [facility] and next door - and in the meantime we do have the option to start  some meds daily that will start to greatly reduce your bleeding with periods or even suppress them for a while so you can get your blood counts back up to normal.     I can send in a prescription for you to start, or we can talk about it more at your follow up appointment.   Let me know your preference,      I consulted Dr. Ancil Boozer about starting OCP while pt waits to f/up with GYN to get IUD out. Reviewed recent labs and OV - [place] Jan eval neg STD and pregnancy test, HSV 2 positive and +BV  She is sexually active with men  Reviewed extensively AUB tx and OCP options - monophasic continuous or extended would be my preference, and something like [facility] - Drospirenone/Ethinyl Estradiol (less potent 4th gen progestin and indicated for PMDD/moods - she could use active pills continuously for a few months and get in with hem/onc for IDA management)  Will see if pt wants to start now or discuss at f/up appt

## 2022-12-30 ENCOUNTER — Other Ambulatory Visit: Payer: Self-pay

## 2022-12-30 DIAGNOSIS — D500 Iron deficiency anemia secondary to blood loss (chronic): Secondary | ICD-10-CM

## 2022-12-31 ENCOUNTER — Inpatient Hospital Stay: Payer: BC Managed Care – PPO | Attending: Oncology

## 2022-12-31 DIAGNOSIS — N920 Excessive and frequent menstruation with regular cycle: Secondary | ICD-10-CM | POA: Diagnosis not present

## 2022-12-31 DIAGNOSIS — D500 Iron deficiency anemia secondary to blood loss (chronic): Secondary | ICD-10-CM

## 2022-12-31 LAB — CBC WITH DIFFERENTIAL (CANCER CENTER ONLY)
Abs Immature Granulocytes: 0 10*3/uL (ref 0.00–0.07)
Basophils Absolute: 0 10*3/uL (ref 0.0–0.1)
Basophils Relative: 1 %
Eosinophils Absolute: 0 10*3/uL (ref 0.0–0.5)
Eosinophils Relative: 1 %
HCT: 28.1 % — ABNORMAL LOW (ref 36.0–46.0)
Hemoglobin: 8.8 g/dL — ABNORMAL LOW (ref 12.0–15.0)
Immature Granulocytes: 0 %
Lymphocytes Relative: 48 %
Lymphs Abs: 1.9 10*3/uL (ref 0.7–4.0)
MCH: 26.9 pg (ref 26.0–34.0)
MCHC: 31.3 g/dL (ref 30.0–36.0)
MCV: 85.9 fL (ref 80.0–100.0)
Monocytes Absolute: 0.5 10*3/uL (ref 0.1–1.0)
Monocytes Relative: 14 %
Neutro Abs: 1.4 10*3/uL — ABNORMAL LOW (ref 1.7–7.7)
Neutrophils Relative %: 36 %
Platelet Count: 270 10*3/uL (ref 150–400)
RBC: 3.27 MIL/uL — ABNORMAL LOW (ref 3.87–5.11)
RDW: 14.6 % (ref 11.5–15.5)
WBC Count: 3.9 10*3/uL — ABNORMAL LOW (ref 4.0–10.5)
nRBC: 0 % (ref 0.0–0.2)

## 2022-12-31 LAB — FERRITIN: Ferritin: 5 ng/mL — ABNORMAL LOW (ref 11–307)

## 2022-12-31 LAB — IRON AND TIBC
Iron: 40 ug/dL (ref 28–170)
Saturation Ratios: 8 % — ABNORMAL LOW (ref 10.4–31.8)
TIBC: 529 ug/dL — ABNORMAL HIGH (ref 250–450)
UIBC: 489 ug/dL

## 2023-01-04 ENCOUNTER — Inpatient Hospital Stay: Payer: BC Managed Care – PPO

## 2023-01-04 ENCOUNTER — Inpatient Hospital Stay: Payer: BC Managed Care – PPO | Admitting: Oncology

## 2023-01-04 ENCOUNTER — Encounter: Payer: Self-pay | Admitting: Oncology

## 2023-01-04 VITALS — BP 127/85 | HR 79 | Temp 98.2°F | Resp 18 | Wt 115.6 lb

## 2023-01-04 DIAGNOSIS — D500 Iron deficiency anemia secondary to blood loss (chronic): Secondary | ICD-10-CM

## 2023-01-04 DIAGNOSIS — N920 Excessive and frequent menstruation with regular cycle: Secondary | ICD-10-CM | POA: Diagnosis not present

## 2023-01-04 MED ORDER — SODIUM CHLORIDE 0.9 % IV SOLN
Freq: Once | INTRAVENOUS | Status: AC
  Filled 2023-01-04: qty 250

## 2023-01-04 MED ORDER — SODIUM CHLORIDE 0.9 % IV SOLN
200.0000 mg | Freq: Once | INTRAVENOUS | Status: AC
  Administered 2023-01-04: 200 mg via INTRAVENOUS
  Filled 2023-01-04: qty 200

## 2023-01-04 NOTE — Progress Notes (Signed)
Patient declined to wait the 30 minutes for post iron infusion observation today.  Post iron education done. Patient verbalized understanding. 

## 2023-01-04 NOTE — Assessment & Plan Note (Signed)
Patient has IUD. Recommend gyn evaluation for heavy menstrual bleeding.

## 2023-01-04 NOTE — Progress Notes (Signed)
Hematology/Oncology Progress note [phone] [phone]     CHIEF COMPLAINTS/REASON FOR VISIT:  iron deficiency anemia  ASSESSMENT & PLAN:   Iron deficiency anemia due to chronic blood loss Previous labs reviewed and discussed with patient.  Consistent with iron deficiency anemia. Recommend IV Venofer weekly x 5.   Menorrhagia with regular cycle Patient has IUD. Recommend gyn evaluation for heavy menstrual bleeding.   Orders Placed This Encounter  Procedures   CBC with Differential ([facility] Only)    Standing Status:   Future    Standing Expiration Date:   01/04/2024   Iron and TIBC    Standing Status:   Future    Standing Expiration Date:   01/04/2024   Ferritin    Standing Status:   Future    Standing Expiration Date:   01/04/2024   Retic Panel    Standing Status:   Future    Standing Expiration Date:   01/04/2024   Follow-up in 3 months. All questions were answered. The patient knows to call the clinic with any problems, questions or concerns.  Earlie Server, MD, PhD [facility] Hematology Oncology 01/04/2023    HISTORY OF PRESENTING ILLNESS:  Margaret Dunn is a  22 y.o.  female with PMH listed below was seen in consultation at the request of Delsa Grana, PA-C   for evaluation of iron deficiency anemia.   Reviewed patient's recent labs  03/04/2021 labs revealed anemia with hemoglobin of 9.4, MCV 85.4, normal white count and platelet count.   Reviewed patient's previous labs ordered by primary care physician's office, anemia is chronic onset , duration is since at least 2018.  Associated signs and symptoms: Patient reports fatigue.  Denies SOB with exertion.  Denies weight loss, easy bruising, hematochezia, hemoptysis, hematuria. Context:  History of iron deficiency: Patient reports longstanding iron deficiency.  She has tried oral iron supplementation previously.  She was not able to continue due to GI side effects. Rectal bleeding:  Denies Menstrual bleeding/ Vaginal bleeding : She has heavy menstrual bleeding. Hematemesis or hemoptysis : denies Blood in urine : denies   She reports previous history of easy bleeding after wisdom teeth extraction. Patient has an IUD  INTERVAL HISTORY Margaret Dunn is a 22 y.o. female who has above history reviewed by me today presents for follow up visit for iron deficiency anemia. Was last seen on 05/27/2021, lost follow-up afterwards.  Today she presents to reestablish care.   12/31/2022, hemoglobin 8.8, MCV 85.9.  Ferritin 5, iron saturation 8, TIBC 529. Reports fatigue, she has previously tried oral iron supplementation which causes constipation.  Currently she is not taking any.  Heavy menstrual bleeding.   Review of Systems  Constitutional:  Positive for fatigue. Negative for appetite change, chills and fever.  HENT:   Negative for hearing loss and voice change.   Eyes:  Negative for eye problems.  Respiratory:  Negative for chest tightness and cough.   Cardiovascular:  Negative for chest pain.  Gastrointestinal:  Negative for abdominal distention, abdominal pain and blood in stool.  Endocrine: Negative for hot flashes.  Genitourinary:  Positive for menstrual problem. Negative for difficulty urinating and frequency.   Musculoskeletal:  Negative for arthralgias.  Skin:  Negative for itching and rash.  Neurological:  Negative for extremity weakness.  Hematological:  Negative for adenopathy.  Psychiatric/Behavioral:  Negative for confusion.     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    SURGICAL HISTORY: Past Surgical History:  Procedure Laterality Date   WISDOM TOOTH EXTRACTION      SOCIAL HISTORY: Social History   Socioeconomic History   Marital  status: Significant Other    Spouse name: [name]   Number of children: Not on file   Years of education: 14   Highest education level: Some college, no degree  Occupational History   Not on file  Tobacco Use   Smoking status: Never   Smokeless tobacco: Never  Vaping Use   Vaping Use: Never used  Substance and Sexual Activity   Alcohol use: Yes    Comment: 2 times a week   Drug use: No   Sexual activity: Yes    Birth control/protection: I.U.D., Condom    Comment: Paragard  Other Topics Concern   Not on file  Social History Narrative   Not on file   Social Determinants of Health   Financial Resource Strain: Low Risk  (03/04/2021)   Overall Financial Resource Strain (CARDIA)    Difficulty of Paying Living Expenses: Not hard at all  Food Insecurity: No Food Insecurity (03/04/2021)   Hunger Vital Sign    Worried About Running Out of Food in the Last Year: Never true    [place] in the Last Year: Never true  Transportation Needs: No Transportation Needs (03/04/2021)   PRAPARE - Hydrologist (Medical): No    Lack of Transportation (Non-Medical): No  Physical Activity: Insufficiently Active (03/04/2021)   Exercise Vital Sign    Days of Exercise per Week: 2 days    Minutes of Exercise per Session: 10 min  Stress: No Stress Concern Present (03/04/2021)   [place]    Feeling of Stress : Only a little  Social Connections: Moderately Integrated (03/04/2021)   Social Connection and Isolation Panel [NHANES]    Frequency of Communication with Friends and Family: More than three times a week    Frequency of Social Gatherings with Friends and Family: More than three times a week    Attends Religious Services: 1 to 4 times per year    Active Member of [employer] or Organizations: Yes    Attends Archivist Meetings: More than 4 times per year    Marital Status: Never married  Intimate  Partner Violence: Not At Risk (11/26/2021)   Humiliation, Afraid, Rape, and Kick questionnaire    Fear of Current or Ex-Partner: No    Emotionally Abused: No    Physically Abused: No    Sexually Abused: No    FAMILY HISTORY: Family History  Problem Relation Age of Onset   Healthy Mother    Healthy Father    Healthy Sister    Healthy Sister    Healthy Sister    Breast cancer Neg Hx    Ovarian cancer Neg Hx    Colon cancer Neg Hx     ALLERGIES:  has No Known Allergies.  MEDICATIONS:  Current Outpatient Medications  Medication Sig Dispense Refill   busPIRone (BUSPAR) 5 MG tablet Take 1-2 tablets (5-10 mg total) by mouth 3 (three) times daily as needed. 60 tablet 2   hydrOXYzine (VISTARIL) 25 MG capsule TAKE 1 CAPSULE(25 MG) BY MOUTH AT BEDTIME AS NEEDED FOR INSOMNIA 30 capsule 0  Multiple Vitamins-Minerals (MULTIVITAMIN WITH MINERALS) tablet Take 1 tablet by mouth daily.     PARAGARD INTRAUTERINE COPPER IU by Intrauterine route.     sertraline (ZOLOFT) 50 MG tablet Take 1 tablet (50 mg total) by mouth at bedtime. 60 tablet 3   No current facility-administered medications for this visit.     PHYSICAL EXAMINATION: ECOG PERFORMANCE STATUS: 0 - Asymptomatic Vitals:   01/04/23 1404  BP: 127/85  Pulse: 79  Resp: 18  Temp: 98.2 F (36.8 C)   Filed Weights   01/04/23 1404  Weight: 115 lb 9.6 oz (52.4 kg)    Physical Exam Constitutional:      General: She is not in acute distress. HENT:     Head: Normocephalic and atraumatic.  Eyes:     General: No scleral icterus. Cardiovascular:     Rate and Rhythm: Normal rate and regular rhythm.     Heart sounds: Normal heart sounds.  Pulmonary:     Effort: Pulmonary effort is normal. No respiratory distress.     Breath sounds: No wheezing or rales.  Abdominal:     General: Bowel sounds are normal. There is no distension.     Palpations: Abdomen is soft.  Musculoskeletal:        General: No deformity. Normal range of motion.      Cervical back: Normal range of motion and neck supple.  Skin:    General: Skin is warm and dry.     Findings: No erythema or rash.  Neurological:     Mental Status: She is alert and oriented to person, place, and time. Mental status is at baseline.     Cranial Nerves: No cranial nerve deficit.     Coordination: Coordination normal.  Psychiatric:        Mood and Affect: Mood normal.          Latest Ref Rng & Units 12/02/2022    3:36 PM  CMP  Glucose 65 - 99 mg/dL 82   BUN 7 - 25 mg/dL 11   Creatinine 0.50 - 0.96 mg/dL 0.69   Sodium 135 - 146 mmol/L 137   Potassium 3.5 - 5.3 mmol/L 4.0   Chloride 98 - 110 mmol/L 104   CO2 20 - 32 mmol/L 24   Calcium 8.6 - 10.2 mg/dL 9.6   Total Protein 6.1 - 8.1 g/dL 7.2   Total Bilirubin 0.2 - 1.2 mg/dL 0.3   AST 10 - 30 U/L 18   ALT 6 - 29 U/L 10       Latest Ref Rng & Units 12/31/2022    3:07 PM  CBC  WBC 4.0 - 10.5 K/uL 3.9   Hemoglobin 12.0 - 15.0 g/dL 8.8   Hematocrit 36.0 - 46.0 % 28.1   Platelets 150 - 400 K/uL 270      LABORATORY DATA:  I have reviewed the data as listed    Latest Ref Rng & Units 12/31/2022    3:07 PM 12/02/2022    3:36 PM 05/28/2022    4:28 AM  CBC  WBC 4.0 - 10.5 K/uL 3.9  3.5  8.6   Hemoglobin 12.0 - 15.0 g/dL 8.8  9.7  10.2   Hematocrit 36.0 - 46.0 % 28.1  29.1  30.8   Platelets 150 - 400 K/uL 270  437  222       Latest Ref Rng & Units 12/02/2022    3:36 PM 05/28/2022    4:28 AM 05/27/2022   12:17 PM  CMP  Glucose 65 - 99 mg/dL 82  94  105   BUN 7 - 25 mg/dL 11  <5  <5   Creatinine 0.50 - 0.96 mg/dL 0.69  0.65  0.68   Sodium 135 - 146 mmol/L 137  137  136   Potassium 3.5 - 5.3 mmol/L 4.0  3.6  3.8   Chloride 98 - 110 mmol/L 104  106  107   CO2 20 - 32 mmol/L 24  25  22    Calcium 8.6 - 10.2 mg/dL 9.6  8.1  8.0   Total Protein 6.1 - 8.1 g/dL 7.2     Total Bilirubin 0.2 - 1.2 mg/dL 0.3     AST 10 - 30 U/L 18     ALT 6 - 29 U/L 10        Iron/TIBC/Ferritin/ %Sat    Component Value  Date/Time   IRON 40 12/31/2022 1507   TIBC 529 (H) 12/31/2022 1507   FERRITIN 5 (L) 12/31/2022 1507   IRONPCTSAT 8 (L) 12/31/2022 1507   IRONPCTSAT 4 (L) 12/02/2022 1536     RADIOGRAPHIC STUDIES: I have personally reviewed the radiological images as listed and agreed with the findings in the report. No results found.

## 2023-01-04 NOTE — Assessment & Plan Note (Addendum)
Previous labs reviewed and discussed with patient.  Consistent with iron deficiency anemia. Recommend IV Venofer weekly x 5.

## 2023-01-06 ENCOUNTER — Encounter: Payer: Self-pay | Admitting: Psychiatry

## 2023-01-06 ENCOUNTER — Telehealth (HOSPITAL_COMMUNITY): Payer: Self-pay | Admitting: Licensed Clinical Social Worker

## 2023-01-06 ENCOUNTER — Encounter (HOSPITAL_COMMUNITY): Payer: Self-pay | Admitting: Licensed Clinical Social Worker

## 2023-01-06 ENCOUNTER — Ambulatory Visit: Payer: BC Managed Care – PPO | Admitting: Psychiatry

## 2023-01-06 VITALS — BP 113/67 | HR 76 | Temp 97.9°F | Ht 64.0 in | Wt 113.4 lb

## 2023-01-06 DIAGNOSIS — F1290 Cannabis use, unspecified, uncomplicated: Secondary | ICD-10-CM | POA: Diagnosis not present

## 2023-01-06 DIAGNOSIS — Z79899 Other long term (current) drug therapy: Secondary | ICD-10-CM | POA: Insufficient documentation

## 2023-01-06 DIAGNOSIS — F39 Unspecified mood [affective] disorder: Secondary | ICD-10-CM

## 2023-01-06 DIAGNOSIS — F1020 Alcohol dependence, uncomplicated: Secondary | ICD-10-CM | POA: Insufficient documentation

## 2023-01-06 DIAGNOSIS — F609 Personality disorder, unspecified: Secondary | ICD-10-CM

## 2023-01-06 NOTE — Telephone Encounter (Signed)
The therapist attempts to reach Margaret Dunn in relation to a referral received by Dr. Shea Evans; however, he is unable to leave a HIPAA-compliant voicemail as her voicemail box is full.  Thus, the therapist sends her a letter via MyChart with his direct contact number.  Adam Phenix, [place], LCSW, [facility], [place] 01/06/2023
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
## 2023-01-06 NOTE — Patient Instructions (Addendum)
[facility] Corporate treasurer) [place]   Phone : [phone]   Please check with your therapist: DBT: Dialectical Behavior Therapy for taking control of your thoughts, emotions, and relationships.  Dialectical Behavior Therapy (DBT) is a structured therapy that focuses on teaching four core skills (mindfulness, acceptance & distress tolerance, emotional regulation, and interpersonal effectiveness) to help you create a good life for yourself. You work on those skills through a series of lessons and then start applying them to your life.Marland Kitchen  DBT is a type of cognitive behavioral therapy (CBT). CBT focuses on helping people change unhelpful thought patterns. DBT takes those CBT ideas or challenging unhelpful thought patterns, but also adds additional elements like mindfulness, acceptance and distress tolerance, and interpersonal skills to give you more tools for dealing with hard situations. Dialectical (the D is DBT) meets opposites, and comes from the idea of combining two of those ideas - change and acceptance.

## 2023-01-11 ENCOUNTER — Inpatient Hospital Stay: Payer: BC Managed Care – PPO

## 2023-01-11 VITALS — BP 123/75 | HR 75 | Temp 97.4°F | Resp 18

## 2023-01-11 DIAGNOSIS — D500 Iron deficiency anemia secondary to blood loss (chronic): Secondary | ICD-10-CM | POA: Diagnosis not present

## 2023-01-11 MED ORDER — SODIUM CHLORIDE 0.9 % IV SOLN
INTRAVENOUS | Status: DC
  Filled 2023-01-11: qty 250

## 2023-01-11 MED ORDER — SODIUM CHLORIDE 0.9 % IV SOLN
200.0000 mg | Freq: Once | INTRAVENOUS | Status: AC
  Administered 2023-01-11: 200 mg via INTRAVENOUS
  Filled 2023-01-11: qty 200

## 2023-01-11 NOTE — Patient Instructions (Signed)

## 2023-01-17 MED FILL — Iron Sucrose Inj 20 MG/ML (Fe Equiv): INTRAVENOUS | Qty: 10 | Status: AC

## 2023-01-18 ENCOUNTER — Encounter: Payer: Self-pay | Admitting: Family Medicine

## 2023-01-18 ENCOUNTER — Inpatient Hospital Stay: Payer: BC Managed Care – PPO

## 2023-01-20 ENCOUNTER — Inpatient Hospital Stay: Payer: BC Managed Care – PPO | Attending: Oncology

## 2023-01-20 VITALS — BP 120/73 | HR 70 | Temp 97.9°F | Resp 16

## 2023-01-20 DIAGNOSIS — N920 Excessive and frequent menstruation with regular cycle: Secondary | ICD-10-CM | POA: Insufficient documentation

## 2023-01-20 DIAGNOSIS — D500 Iron deficiency anemia secondary to blood loss (chronic): Secondary | ICD-10-CM | POA: Diagnosis not present

## 2023-01-20 MED ORDER — SODIUM CHLORIDE 0.9 % IV SOLN
INTRAVENOUS | Status: DC
  Filled 2023-01-20: qty 250

## 2023-01-20 MED ORDER — SODIUM CHLORIDE 0.9 % IV SOLN
200.0000 mg | Freq: Once | INTRAVENOUS | Status: AC
  Administered 2023-01-20: 200 mg via INTRAVENOUS
  Filled 2023-01-20: qty 200

## 2023-01-20 NOTE — Progress Notes (Signed)
Declined 30 minute observation post-venofer. Aware of potential adverse effects and when to seek medical attention. Vitals stable at discharge.

## 2023-01-20 NOTE — Patient Instructions (Signed)

## 2023-01-25 ENCOUNTER — Inpatient Hospital Stay: Payer: BC Managed Care – PPO

## 2023-01-25 VITALS — BP 137/72 | HR 70 | Temp 97.3°F | Resp 18

## 2023-01-25 DIAGNOSIS — D500 Iron deficiency anemia secondary to blood loss (chronic): Secondary | ICD-10-CM | POA: Diagnosis not present

## 2023-01-25 MED ORDER — SODIUM CHLORIDE 0.9 % IV SOLN
200.0000 mg | Freq: Once | INTRAVENOUS | Status: AC
  Administered 2023-01-25: 200 mg via INTRAVENOUS
  Filled 2023-01-25: qty 200

## 2023-01-25 MED ORDER — SODIUM CHLORIDE 0.9 % IV SOLN
INTRAVENOUS | Status: DC
  Filled 2023-01-25: qty 250

## 2023-01-25 NOTE — Patient Instructions (Signed)

## 2023-02-01 ENCOUNTER — Inpatient Hospital Stay: Payer: BC Managed Care – PPO

## 2023-02-01 VITALS — BP 115/69 | HR 74 | Temp 99.3°F | Resp 18

## 2023-02-01 DIAGNOSIS — D500 Iron deficiency anemia secondary to blood loss (chronic): Secondary | ICD-10-CM | POA: Diagnosis not present

## 2023-02-01 MED ORDER — SODIUM CHLORIDE 0.9 % IV SOLN
Freq: Once | INTRAVENOUS | Status: AC
  Filled 2023-02-01: qty 250

## 2023-02-01 MED ORDER — SODIUM CHLORIDE 0.9 % IV SOLN
200.0000 mg | Freq: Once | INTRAVENOUS | Status: AC
  Administered 2023-02-01: 200 mg via INTRAVENOUS
  Filled 2023-02-01: qty 200

## 2023-04-06 ENCOUNTER — Inpatient Hospital Stay: Payer: BC Managed Care – PPO | Attending: Oncology

## 2023-04-11 MED FILL — Iron Sucrose Inj 20 MG/ML (Fe Equiv): INTRAVENOUS | Qty: 10 | Status: AC

## 2023-04-12 ENCOUNTER — Inpatient Hospital Stay: Payer: BC Managed Care – PPO | Admitting: Oncology

## 2023-04-12 ENCOUNTER — Inpatient Hospital Stay: Payer: BC Managed Care – PPO

## 2023-04-17 ENCOUNTER — Telehealth: Payer: BC Managed Care – PPO | Admitting: Family Medicine

## 2023-04-17 DIAGNOSIS — B9689 Other specified bacterial agents as the cause of diseases classified elsewhere: Secondary | ICD-10-CM | POA: Diagnosis not present

## 2023-04-17 DIAGNOSIS — J019 Acute sinusitis, unspecified: Secondary | ICD-10-CM | POA: Diagnosis not present

## 2023-04-17 MED ORDER — AMOXICILLIN-POT CLAVULANATE 875-125 MG PO TABS: 1.0000 | ORAL_TABLET | Freq: Two times a day (BID) | ORAL | 0 refills | Status: DC

## 2023-04-17 NOTE — Progress Notes (Signed)

## 2023-04-28 ENCOUNTER — Encounter: Payer: Self-pay | Admitting: Family Medicine

## 2023-06-30 ENCOUNTER — Telehealth: Payer: BC Managed Care – PPO | Admitting: Physician Assistant

## 2023-06-30 DIAGNOSIS — B9689 Other specified bacterial agents as the cause of diseases classified elsewhere: Secondary | ICD-10-CM | POA: Diagnosis not present

## 2023-06-30 DIAGNOSIS — J019 Acute sinusitis, unspecified: Secondary | ICD-10-CM | POA: Diagnosis not present

## 2023-06-30 MED ORDER — AMOXICILLIN-POT CLAVULANATE 875-125 MG PO TABS
1.0000 | ORAL_TABLET | Freq: Two times a day (BID) | ORAL | 0 refills | Status: DC
Start: 2023-06-30 — End: 2023-09-30

## 2023-06-30 NOTE — Progress Notes (Signed)
E-Visit for Sinus Problems  We are sorry that you are not feeling well.  Here is how we plan to help!  Based on what you have shared with me it looks like you have sinusitis.  Sinusitis is inflammation and infection in the sinus cavities of the head.  Based on your presentation I believe you most likely have Acute Bacterial Sinusitis.  This is an infection caused by bacteria and is treated with antibiotics. I have prescribed  Augmentin 875mg/125mg one tablet twice daily with food, for 10 days.You may use an oral decongestant such as Mucinex D or if you have glaucoma or high blood pressure use plain Mucinex. Saline nasal spray help and can safely be used as often as needed for congestion.  If you develop worsening sinus pain, fever or notice severe headache and vision changes, or if symptoms are not better after completion of antibiotic, please schedule an appointment with a health care provider.    Sinus infections are not as easily transmitted as other respiratory infection, however we still recommend that you avoid close contact with loved ones, especially the very young and elderly.  Remember to wash your hands thoroughly throughout the day as this is the number one way to prevent the spread of infection!  Home Care: Only take medications as instructed by your medical team. Complete the entire course of an antibiotic. Do not take these medications with alcohol. A steam or ultrasonic humidifier can help congestion.  You can place a towel over your head and breathe in the steam from hot water coming from a faucet. Avoid close contacts especially the very young and the elderly. Cover your mouth when you cough or sneeze. Always remember to wash your hands.  Get Help Right Away If: You develop worsening fever or sinus pain. You develop a severe head ache or visual changes. Your symptoms persist after you have completed your treatment plan.  Make sure you Understand these instructions. Will  watch your condition. Will get help right away if you are not doing well or get worse.  Thank you for choosing an e-visit.  Your e-visit answers were reviewed by a board certified advanced clinical practitioner to complete your personal care plan. Depending upon the condition, your plan could have included both over the counter or prescription medications.  Please review your pharmacy choice. Make sure the pharmacy is open so you can pick up prescription now. If there is a problem, you may contact your provider through MyChart messaging and have the prescription routed to another pharmacy.  Your safety is important to us. If you have drug allergies check your prescription carefully.   For the next 24 hours you can use MyChart to ask questions about today's visit, request a non-urgent call back, or ask for a work or school excuse. You will get an email in the next two days asking about your experience. I hope that your e-visit has been valuable and will speed your recovery.  I have spent 5 minutes in review of e-visit questionnaire, review and updating patient chart, medical decision making and response to patient.   Jennifer M Burnette, PA-C  

## 2023-08-11 IMAGING — CR DG CHEST 2V
1 series · 2 of 2 positions shown · non-contrast
Comparison: None.

CLINICAL DATA: Cough, fever and chest tightness since [REDACTED], but
has been worse since yesterday.

EXAM:
CHEST - 2 VIEW

[Series 1: dg chest 2 view · 0.14mm/px · 2 of 2 slices shown]
[im 1/2]
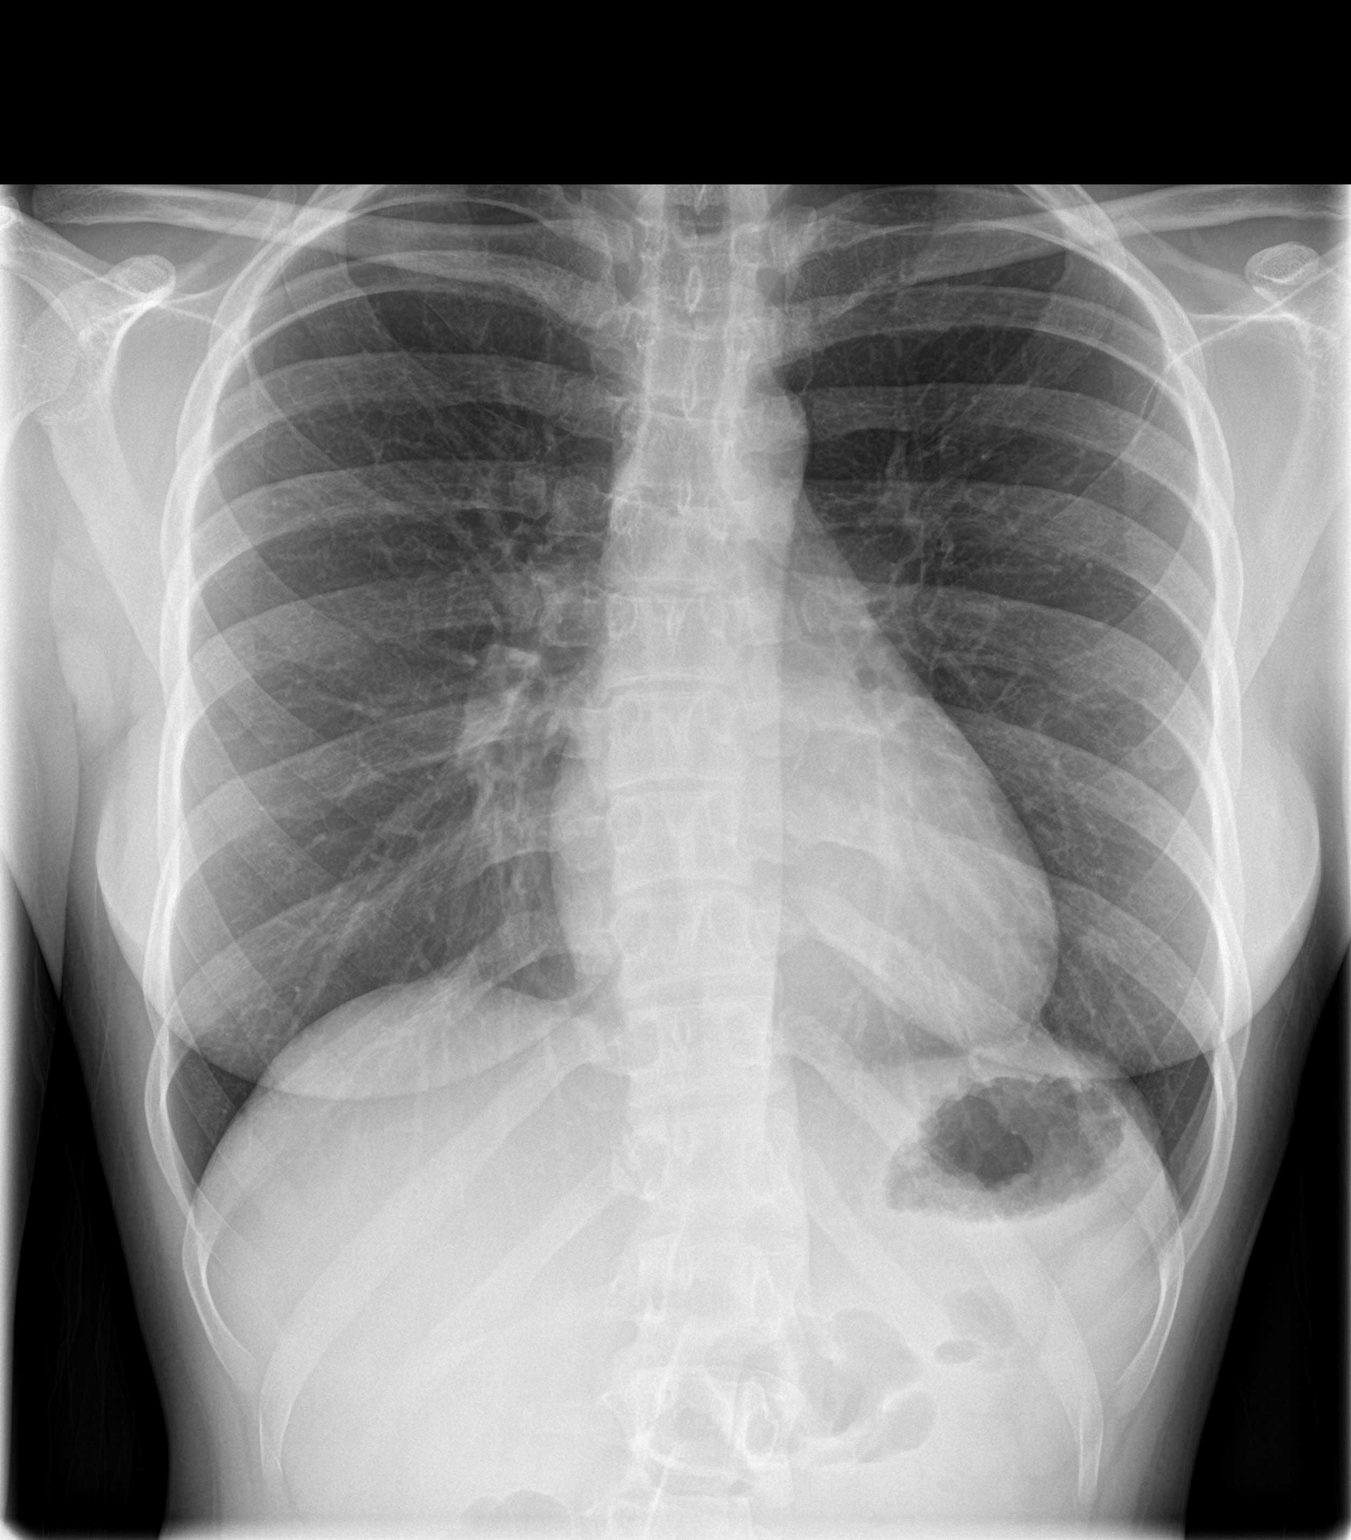
[im 2/2]
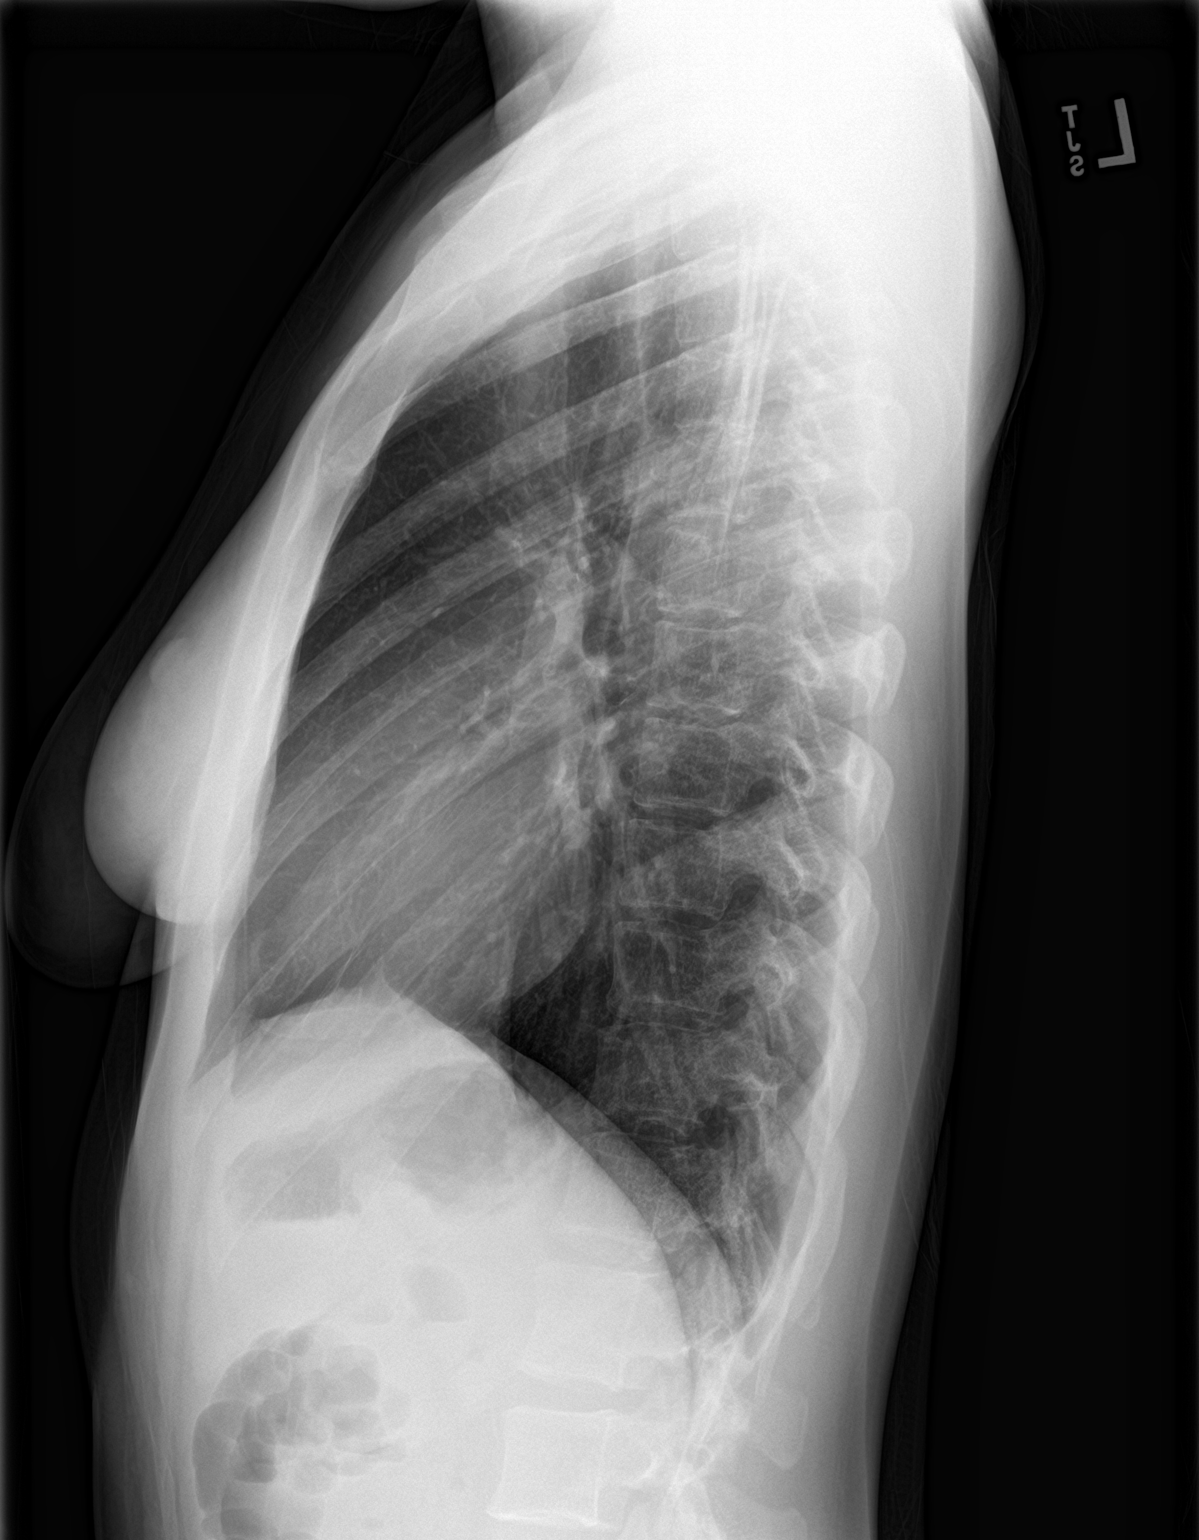

[2 of 2 positions shown; findings below may reference images not displayed]

FINDINGS: The cardiomediastinal silhouette is within normal limits. The lungs
are well inflated and clear. There is no evidence of pleural
effusion or pneumothorax. No acute osseous abnormality is
identified.
IMPRESSION: No active cardiopulmonary disease.

## 2023-09-30 ENCOUNTER — Ambulatory Visit: Payer: BC Managed Care – PPO | Admitting: Physician Assistant

## 2023-09-30 ENCOUNTER — Encounter: Payer: Self-pay | Admitting: Physician Assistant

## 2023-09-30 VITALS — BP 116/68 | HR 65 | Resp 16 | Ht 64.0 in | Wt 115.0 lb

## 2023-09-30 DIAGNOSIS — Z975 Presence of (intrauterine) contraceptive device: Secondary | ICD-10-CM | POA: Diagnosis not present

## 2023-09-30 DIAGNOSIS — N920 Excessive and frequent menstruation with regular cycle: Secondary | ICD-10-CM

## 2023-09-30 NOTE — Progress Notes (Signed)
Acute Office Visit   Patient: Margaret Dunn   DOB: June 14, 2001   22 y.o. Female  MRN: [id number] Visit Date: 09/30/2023  Today's healthcare provider: Oswaldo Conroy Lula Kolton, PA-C  Introduced myself to the patient as a Secondary school teacher and provided education on APPs in clinical practice.    Chief Complaint  Patient presents with   Dysmenorrhea    States she has been cramping constantly and bleeding very heavy. She was bleeding through pads and OTC menstrual sx relief meds mildly helped. Just stopped her period.   Subjective    HPI HPI     Dysmenorrhea    Additional comments: States she has been cramping constantly and bleeding very heavy. She was bleeding through pads and OTC menstrual sx relief meds mildly helped. Just stopped her period.      Last edited by Dollene Primrose, CMA on 09/30/2023  1:39 PM.       Heavy bleeding   Duration: ongoing- she reports severe period bleeding with her periods- current period just ended yesterday  Associated symptoms: She reports bleeding through pads and clothing while having period and would like alternatives   Interventions: She has Paraguard IUD in place. She reports she was previously on Depo but this made her very emotional  She is not comfortable with starting OCP due to forgetfulness  She has never tried a hormonal IUD     Medications: Outpatient Medications Prior to Visit  Medication Sig   busPIRone (BUSPAR) 5 MG tablet Take 1-2 tablets (5-10 mg total) by mouth 3 (three) times daily as needed.   hydrOXYzine (VISTARIL) 25 MG capsule TAKE 1 CAPSULE(25 MG) BY MOUTH AT BEDTIME AS NEEDED FOR INSOMNIA   PARAGARD INTRAUTERINE COPPER IU by Intrauterine route.   Multiple Vitamins-Minerals (MULTIVITAMIN WITH MINERALS) tablet Take 1 tablet by mouth daily. (Patient not taking: Reported on 09/30/2023)   sertraline (ZOLOFT) 50 MG tablet Take 1 tablet (50 mg total) by mouth at bedtime. (Patient not taking: Reported on 09/30/2023)   [DISCONTINUED]  amoxicillin-clavulanate (AUGMENTIN) 875-125 MG tablet Take 1 tablet by mouth 2 (two) times daily.   No facility-administered medications prior to visit.    Review of Systems  Constitutional:  Negative for chills, fatigue and fever.  Respiratory:  Positive for shortness of breath. Negative for cough and wheezing.   Genitourinary:  Positive for vaginal bleeding.        Objective    BP 116/68   Pulse 65   Resp 16   Ht 5\' 4"  (1.626 m)   Wt 115 lb (52.2 kg)   LMP 09/30/2023   SpO2 95%   BMI 19.74 kg/m     Physical Exam Vitals reviewed.  Constitutional:      General: She is awake.     Appearance: Normal appearance. She is well-developed and well-groomed.  HENT:     Head: Normocephalic and atraumatic.  Cardiovascular:     Rate and Rhythm: Normal rate and regular rhythm.     Pulses: Normal pulses.          Radial pulses are 2+ on the right side and 2+ on the left side.     Heart sounds: Normal heart sounds. No murmur heard.    No friction rub. No gallop.  Pulmonary:     Effort: Pulmonary effort is normal.     Breath sounds: Normal breath sounds. No decreased air movement. No decreased breath sounds, wheezing, rhonchi or rales.  Musculoskeletal:  Cervical back: Normal range of motion.     Right lower leg: No edema.     Left lower leg: No edema.  Neurological:     General: No focal deficit present.     Mental Status: She is alert and oriented to person, place, and time. Mental status is at baseline.     GCS: GCS eye subscore is 4. GCS verbal subscore is 5. GCS motor subscore is 6.  Psychiatric:        Attention and Perception: Attention and perception normal.        Mood and Affect: Mood and affect normal.        Speech: Speech normal.        Behavior: Behavior normal. Behavior is cooperative.        Thought Content: Thought content normal.        Cognition and Memory: Cognition normal.       No results found for any visits on 09/30/23.  Assessment & Plan       No follow-ups on file.      Problem List Items Addressed This Visit       Other   Menorrhagia with regular cycle - Primary (Chronic)   Relevant Orders   Ambulatory referral to Obstetrics / Gynecology   IUD (intrauterine device) in place   Relevant Orders   Ambulatory referral to Obstetrics / Gynecology   Chronic, ongoing Patient reports significant menstrual bleeding along with cramping that has resolved now that her period is over She currently has ParaGard IUD which I discussed with her can make periods more heavy Reviewed different hormonal birth control options with her.  She is most interested in potentially speaking with OB/GYN regarding hormonal IUD Referral placed for GYN services Patient declined lab work to check for anemia Follow-up as needed for progressing or persistent symptoms  No follow-ups on file.   I, Geroge Gilliam E Anil Havard, PA-C, have reviewed all documentation for this visit. The documentation on 09/30/23 for the exam, diagnosis, procedures, and orders are all accurate and complete.   Jacquelin Hawking, MHS, PA-C [facility]

## 2023-10-17 ENCOUNTER — Encounter: Payer: Self-pay | Admitting: Oncology

## 2023-10-20 ENCOUNTER — Encounter: Payer: Self-pay | Admitting: Oncology

## 2023-10-20 ENCOUNTER — Encounter: Payer: Self-pay | Admitting: Licensed Practical Nurse

## 2023-10-20 ENCOUNTER — Ambulatory Visit: Payer: 59 | Admitting: Licensed Practical Nurse

## 2023-10-20 VITALS — BP 116/87 | HR 77 | Ht 64.0 in | Wt 117.4 lb

## 2023-10-20 DIAGNOSIS — N920 Excessive and frequent menstruation with regular cycle: Secondary | ICD-10-CM | POA: Diagnosis not present

## 2023-10-20 MED ORDER — IBUPROFEN 800 MG PO TABS: 800.0000 mg | ORAL_TABLET | Freq: Three times a day (TID) | ORAL | 1 refills | Status: DC | PRN

## 2023-10-20 NOTE — Progress Notes (Signed)
 HPI:      [name] is a 23 y.o. G0P0000 who LMP was Patient's last menstrual period was 09/29/2023 (exact date)., presents today for a problem visit.  She experiences painful and heavy menstrual cycles. She would like a prescription for 800mg  Ibuprofen  so that she does not have to take multiple tablets at one time.   Cycles are monthly, they last 1 weeks, she needs to change her pads every 2-4 hours, sometimes she will bleed through her clothes. She has intense cramping for the first 3-4 days. She takes 2-3 tablets of Ibuprofen  200mg , which helps. She is not interested in switching to a hormonal IUD, she does not want anything that messes with her hormones. She is not bothered by the bleeding that she experiences.   She is sexually active with 1 female partner, she has had 1 partner in the last 3 months, she was last screened for STI's in August, declines testing today .     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 Also,  has a past surgical history that includes Wisdom tooth extraction., family history includes Alcohol abuse in her father; Healthy in her father, mother, sister, sister, and sister; Mental illness in her father.,  reports that she has never smoked. She has never used smokeless tobacco. She reports current alcohol use of about 12.0 standard drinks of alcohol per week. She reports current drug use. Drug: Marijuana.  She  Current Outpatient Medications:    ibuprofen  (ADVIL ) 800 MG tablet, Take 1 tablet (800 mg total) by mouth every 8 (eight) hours as needed., Disp: 30 tablet, Rfl: 1   PARAGARD  INTRAUTERINE COPPER  IU, by Intrauterine route., Disp: , Rfl:    busPIRone  (BUSPAR ) 5 MG tablet, Take 1-2 tablets (5-10 mg total) by mouth 3 (three) times daily as needed. (Patient not taking: Reported on 10/20/2023),  Disp: 60 tablet, Rfl: 2   hydrOXYzine  (VISTARIL ) 25 MG capsule, TAKE 1 CAPSULE(25 MG) BY MOUTH AT BEDTIME AS NEEDED FOR INSOMNIA (Patient not taking: Reported on 10/20/2023), Disp: 30 capsule, Rfl: 0   Multiple Vitamins-Minerals (MULTIVITAMIN WITH MINERALS) tablet, Take 1 tablet by mouth daily. (Patient not taking: Reported on 10/20/2023), Disp: , Rfl:    sertraline  (ZOLOFT ) 50 MG tablet, Take 1 tablet (50 mg total) by mouth at bedtime. (Patient not taking: Reported on 10/20/2023), Disp: 60 tablet, Rfl: 3  Also, has no known allergies.  ROS see HPI   Objective: BP 116/87 (BP Location: Left Arm, Patient Position: Sitting, Cuff Size: Normal)   Pulse 77   Ht 5' 4 (1.626 m)   Wt 117 lb 6.4 oz (53.3 kg)   LMP 09/29/2023 (Exact Date)   BMI 20.15 kg/m  Physical Exam Constitutional:      Appearance: Normal appearance.  Cardiovascular:     Rate and Rhythm: Normal rate.  Pulmonary:     Effort: Pulmonary effort is normal.  Neurological:     Mental Status: She is alert.  Psychiatric:        Mood and Affect: Mood normal.   PE not necessary for purpose of visit   ASSESSMENT/PLAN:  Heavy menstrual bleeding   Problem List Items Addressed This Visit       Other   Menorrhagia with regular cycle - Primary (Chronic)   Relevant Medications   ibuprofen  (ADVIL ) 800 MG tablet    -reviewed Hormonal IUD  acts locally on the uterus, we do not typically see systemic effects. You may safely consider the hormonal IUD if the bleeding becomes bothersome to you.   -Script for Motrin  sent, discussed concerns for GI effects   Jinnie Cookey, CNM  [facility]  10/20/23  2:21 PM

## 2023-11-20 ENCOUNTER — Telehealth: Payer: 59 | Admitting: Nurse Practitioner

## 2023-11-20 ENCOUNTER — Encounter: Payer: Self-pay | Admitting: Oncology

## 2023-11-20 DIAGNOSIS — B9789 Other viral agents as the cause of diseases classified elsewhere: Secondary | ICD-10-CM

## 2023-11-20 DIAGNOSIS — J019 Acute sinusitis, unspecified: Secondary | ICD-10-CM | POA: Diagnosis not present

## 2023-11-20 MED ORDER — IPRATROPIUM BROMIDE 0.03 % NA SOLN: 2.0000 | Freq: Two times a day (BID) | NASAL | 12 refills | Status: DC

## 2023-11-20 NOTE — Progress Notes (Signed)

## 2023-11-20 NOTE — Progress Notes (Signed)
 I have spent 5 minutes in review of e-visit questionnaire, review and updating patient chart, medical decision making and response to patient.   Claiborne Rigg, NP

## 2024-07-31 ENCOUNTER — Telehealth: Admitting: Physician Assistant

## 2024-07-31 DIAGNOSIS — J069 Acute upper respiratory infection, unspecified: Secondary | ICD-10-CM | POA: Diagnosis not present

## 2024-07-31 MED ORDER — BENZONATATE 100 MG PO CAPS: 100.0000 mg | ORAL_CAPSULE | Freq: Three times a day (TID) | ORAL | 0 refills | Status: DC | PRN

## 2024-07-31 MED ORDER — FLUTICASONE PROPIONATE 50 MCG/ACT NA SUSP: 2.0000 | Freq: Every day | NASAL | 0 refills | Status: DC

## 2024-07-31 MED ORDER — ALBUTEROL SULFATE HFA 108 (90 BASE) MCG/ACT IN AERS: 2.0000 | INHALATION_SPRAY | Freq: Four times a day (QID) | RESPIRATORY_TRACT | 0 refills | Status: AC | PRN

## 2024-07-31 NOTE — Progress Notes (Signed)

## 2024-08-01 ENCOUNTER — Ambulatory Visit: Admitting: Internal Medicine

## 2024-08-01 ENCOUNTER — Other Ambulatory Visit: Payer: Self-pay

## 2024-08-01 VITALS — BP 134/82 | HR 83 | Temp 98.2°F | Resp 16 | Ht 64.0 in | Wt 116.9 lb

## 2024-08-01 DIAGNOSIS — J40 Bronchitis, not specified as acute or chronic: Secondary | ICD-10-CM | POA: Diagnosis not present

## 2024-08-01 NOTE — Progress Notes (Signed)
 Acute Office Visit  Subjective:     Patient ID: Margaret Dunn, female    DOB: 01-13-2001, 23 y.o.   MRN: [id number]  Chief Complaint  Patient presents with   Follow-up    UC for wheezing and SOB    HPI Patient is in today for wheezing and SOB. Patient was seen at Urgent Care earlier today. COVID, flu and strep all negative. She was prescribed Augmentin  x 10 days but hasn't picked it up yet.   Discussed the use of AI scribe software for clinical note transcription with the patient, who gave verbal consent to proceed.  History of Present Illness Margaret Dunn is a 23 year old female who presents with shortness of breath and wheezing.  She has experienced shortness of breath and wheezing for the past few days. She has a history of seasonal allergies and previously had bronchitis after a similar episode last year. She was given an albuterol inhaler at urgent care today and has an ipratropium inhaler, though she is unsure about refills. She uses Mucinex and Theraflu for symptom management. She denies chronic respiratory illnesses like asthma.  She experiences chills, which she attributes to anemia, and has a cough producing dark mucus. She feels hoarse and occasionally has headaches. Her primary concern is chest tightness and difficulty taking deep breaths.  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 She took today off to address her health issues and prevent worsening of her condition.   Review of Systems  Constitutional:  Negative for chills and fever.  HENT:  Positive for congestion. Negative for ear pain, sinus pain and sore throat.   Respiratory:  Positive for cough, shortness of breath and wheezing.         Objective:    BP 134/82 (Cuff Size: Large)   Pulse 83   Temp 98.2 F (36.8 C) (Oral)   Resp 16   Ht 5' 4 (1.626 m)   Wt 116 lb 14.4 oz (53 kg)   SpO2 99%   BMI 20.07 kg/m  BP Readings from Last 3 Encounters:  08/01/24  134/82  10/20/23 116/87  09/30/23 116/68   Wt Readings from Last 3 Encounters:  08/01/24 116 lb 14.4 oz (53 kg)  10/20/23 117 lb 6.4 oz (53.3 kg)  09/30/23 115 lb (52.2 kg)      Physical Exam Constitutional:      Appearance: Normal appearance.  HENT:     Head: Normocephalic and atraumatic.     Right Ear: Tympanic membrane, ear canal and external ear normal.     Left Ear: Tympanic membrane, ear canal and external ear normal.     Nose: Nose normal.     Mouth/Throat:     Mouth: Mucous membranes are moist.     Pharynx: Oropharynx is clear.  Eyes:     Conjunctiva/sclera: Conjunctivae normal.  Cardiovascular:     Rate and Rhythm: Normal rate and regular rhythm.  Pulmonary:     Effort: Pulmonary effort is normal.     Breath sounds: No wheezing, rhonchi or rales.     Comments: Decreased air movements throughout  Skin:    General: Skin is warm and dry.  Neurological:     General: No focal deficit present.     Mental Status: She is alert. Mental status is at baseline.  Psychiatric:        Mood and Affect: Mood normal.        Behavior: Behavior normal.  No results found for any visits on 08/01/24.      Assessment & Plan:   Assessment & Plan Acute bronchitis Symptoms consistent with acute bronchitis. Negative for COVID-19, influenza, and streptococcal infection. Likely airway inflammation. - Use albuterol inhaler every 4-6 hours as needed. - Continue Augmentin  for 10 days. - Use Mucinex in the morning. - Use cough suppressant at night. - Advised on potential diarrhea from Augmentin ; recommend probiotic or yogurt. - Contact office if symptoms worsen or persist by next week.   Return if symptoms worsen or fail to improve.  Sharyle Fischer, DO

## 2024-08-02 ENCOUNTER — Encounter: Payer: Self-pay | Admitting: Internal Medicine

## 2024-08-28 ENCOUNTER — Encounter: Payer: Self-pay | Admitting: Family Medicine

## 2024-08-29 ENCOUNTER — Telehealth: Admitting: Physician Assistant

## 2024-08-29 ENCOUNTER — Encounter

## 2024-08-29 DIAGNOSIS — J069 Acute upper respiratory infection, unspecified: Secondary | ICD-10-CM | POA: Diagnosis not present

## 2024-08-29 MED ORDER — LIDOCAINE VISCOUS HCL 2 % MT SOLN: 5.0000 mL | Freq: Four times a day (QID) | OROMUCOSAL | 0 refills | Status: AC | PRN

## 2024-08-29 MED ORDER — PSEUDOEPH-BROMPHEN-DM 30-2-10 MG/5ML PO SYRP: 5.0000 mL | ORAL_SOLUTION | Freq: Four times a day (QID) | ORAL | 0 refills | Status: AC | PRN

## 2024-08-29 NOTE — Progress Notes (Signed)
 Message sent to patient requesting further input regarding current symptoms. Awaiting patient response.

## 2024-08-29 NOTE — Progress Notes (Signed)
 We are sorry you are not feeling well.  Here is how we plan to help!  Based on what you have shared with me, it looks like you may have a viral upper respiratory infection.  Upper respiratory infections are caused by a large number of viruses; however, rhinovirus is the most common cause.   Symptoms vary from person to person, with common symptoms including sore throat, cough, and fatigue or lack of energy, and a feeling of general discomfort.  A low-grade fever of up to 100.4 may present, but is often uncommon.  Symptoms vary however, and are closely related to a person's age or underlying illnesses.  The most common symptoms associated with an upper respiratory infection are nasal discharge or congestion, cough, sneezing, headache and pressure in the ears and face.  These symptoms usually persist for about 3 to 10 days, but can last up to 2 weeks.  It is important to know that upper respiratory infections do not cause serious illness or complications in most cases.    Upper respiratory infections can be transmitted from person to person, with the most common method of transmission being a person's hands.  The virus is able to live on the skin and can infect other persons for up to 2 hours after direct contact.  Also, these can be transmitted when someone coughs or sneezes; thus, it is important to cover the mouth to reduce this risk.  To keep the spread of the illness at bay, good hand hygiene is very important!  Because this is a viral infection, there are no specific treatments other than to help you with the symptoms until the infection runs its course.    For nasal congestion, you may use an oral decongestants such as Mucinex D or if you have glaucoma or high blood pressure use plain Mucinex.  Saline nasal spray or nasal drops can help and can safely be used as often as needed for congestion.   If you do not have a history of heart disease, hypertension, diabetes or thyroid disease,  prostate/bladder issues or glaucoma, you may also use Sudafed to treat nasal congestion.  It is highly recommended that you consult with a pharmacist or your primary care physician to ensure this medication is safe for you to take.     If you have a cough, you may use over-the-counter cough suppressants such as Delsym and Robitussin.  If you have glaucoma or high blood pressure, you can also use Coricidin HBP.   For cough I have prescribed for you A prescription cough medication called Bromfed-DM 2-30-10 mg/5 mL.  Take 5 mL every 6 hours as needed for cough.  If you have a sore or scratchy throat, use a saltwater gargle-  to  teaspoon of salt dissolved in a 4-ounce to 8-ounce glass of warm water.  Gargle the solution for approximately 15-30 seconds and then spit.  It is important not to swallow the solution.  You can also use throat lozenges/cough drops and Chloraseptic spray to help with throat pain or discomfort.  Warm or cold liquids can also be helpful in relieving throat pain. I have also prescribed I have prescribed a Viscous Lidocaine 2% solution. Swallow 5-10 mL every 4-6 hours as needed for sore throat. DO NOT eat or drink anything for 15-20 minutes after swallowing to allow the medication to coat the throat.  For headache, pain, or general discomfort, you can use Ibuprofen  or Tylenol  as directed.   Some authorities believe that zinc  sprays or the use of Echinacea may shorten the course of your symptoms.   HOME CARE Only take medications as instructed by your medical team. Be sure to drink plenty of fluids. Water is fine as well as fruit juices, sodas and electrolyte beverages. You may want to stay away from caffeine or alcohol. If you are nauseated, try taking small sips of liquids. How do you know if you are getting enough fluid? Your urine should be a pale yellow or almost colorless. Get rest. Taking a steamy shower or using a humidifier may help nasal congestion and ease sore throat  pain. You can place a towel over your head and breathe in the steam from hot water coming from a faucet. Using a saline nasal spray works much the same way. Cough drops, hard candies and sore throat lozenges may ease your cough. Avoid close contacts especially the very young and the elderly Cover your mouth if you cough or sneeze Always remember to wash your hands.   GET HELP RIGHT AWAY IF: You develop worsening fever. If your symptoms do not improve within 10 days You develop yellow or green discharge from your nose over 3 days. You have coughing fits You develop a severe head ache or visual changes. You develop shortness of breath, difficulty breathing or start having chest pain Your symptoms persist after you have completed your treatment plan  MAKE SURE YOU  Understand these instructions. Will watch your condition. Will get help right away if you are not doing well or get worse.  Your e-visit answers were reviewed by a board certified advanced clinical practitioner to complete your personal care plan. Depending upon the condition, your plan could have included both over-the-counter or prescription medications.  Please review your pharmacy choice. If there is a problem, you may call our nursing hot line at and have the prescription routed to another pharmacy. Your safety is important to us . If you have drug allergies, check your prescription carefully.   You can use MyChart to ask questions about today's visit, request a non-urgent call back, or ask for a work or school excuse for 24 hours related to this e-Visit. If it has been greater than 24 hours you will need to follow up with your provider, or enter a new e-Visit to address those concerns. You will get an e-mail in the next two days asking about your experience.  I hope that your e-visit has been valuable and will speed your recovery. Thank you for using e-visits.   I have spent 5 minutes in review of e-visit questionnaire,  review and updating patient chart, medical decision making and response to patient.   Delon CHRISTELLA Dickinson, PA-C

## 2024-08-30 ENCOUNTER — Ambulatory Visit: Admitting: Internal Medicine
